# Patient Record
Sex: Male | Born: 1960 | Race: White | Hispanic: No | Marital: Married | Smoking: Former smoker
Health system: Southern US, Community
[De-identification: ages and names within clinical notes are randomized; demographics above are authoritative.]

## PROBLEM LIST (undated history)

## (undated) DIAGNOSIS — E119 Type 2 diabetes mellitus without complications: Secondary | ICD-10-CM

---

## 2006-03-17 ENCOUNTER — Inpatient Hospital Stay: Payer: Self-pay | Admitting: Internal Medicine

## 2006-03-17 ENCOUNTER — Other Ambulatory Visit: Payer: Self-pay

## 2007-07-12 ENCOUNTER — Ambulatory Visit: Payer: Self-pay | Admitting: Family Medicine

## 2007-08-29 ENCOUNTER — Other Ambulatory Visit: Payer: Self-pay

## 2007-08-29 ENCOUNTER — Emergency Department: Payer: Self-pay | Admitting: Emergency Medicine

## 2008-07-07 ENCOUNTER — Ambulatory Visit: Payer: Self-pay | Admitting: Family Medicine

## 2008-10-06 ENCOUNTER — Ambulatory Visit: Payer: Self-pay | Admitting: Family Medicine

## 2009-03-29 ENCOUNTER — Ambulatory Visit: Payer: Self-pay | Admitting: Family Medicine

## 2009-11-25 ENCOUNTER — Ambulatory Visit: Payer: Self-pay | Admitting: Family Medicine

## 2010-08-19 ENCOUNTER — Ambulatory Visit: Payer: Self-pay | Admitting: Family Medicine

## 2012-08-14 ENCOUNTER — Ambulatory Visit: Payer: Self-pay | Admitting: Family Medicine

## 2012-08-14 LAB — CREATININE, SERUM
Creatinine: 0.98 mg/dL (ref 0.60–1.30)
EGFR (African American): >60

## 2013-06-04 ENCOUNTER — Ambulatory Visit: Payer: Self-pay | Admitting: Family Medicine

## 2013-06-04 LAB — CREATININE, SERUM
Creatinine: 1.09 mg/dL (ref 0.60–1.30)
EGFR (African American): >60
EGFR (Non-African Amer.): >60

## 2013-06-26 ENCOUNTER — Ambulatory Visit: Payer: Self-pay | Admitting: Family Medicine

## 2013-07-22 ENCOUNTER — Encounter: Payer: Self-pay | Admitting: *Deleted

## 2013-08-13 ENCOUNTER — Ambulatory Visit: Payer: Self-pay | Admitting: General Surgery

## 2013-08-26 ENCOUNTER — Encounter: Payer: Self-pay | Admitting: General Surgery

## 2013-08-27 ENCOUNTER — Ambulatory Visit: Payer: Self-pay | Admitting: General Surgery

## 2013-09-22 ENCOUNTER — Ambulatory Visit: Payer: Self-pay | Admitting: General Surgery

## 2013-10-15 ENCOUNTER — Encounter: Payer: Self-pay | Admitting: *Deleted

## 2014-02-23 ENCOUNTER — Ambulatory Visit: Payer: Self-pay | Admitting: Family Medicine

## 2014-09-01 ENCOUNTER — Ambulatory Visit: Payer: Self-pay | Admitting: Family Medicine

## 2015-04-05 ENCOUNTER — Other Ambulatory Visit: Payer: Self-pay | Admitting: Family Medicine

## 2015-04-05 DIAGNOSIS — R1010 Upper abdominal pain, unspecified: Secondary | ICD-10-CM

## 2015-04-07 ENCOUNTER — Ambulatory Visit: Payer: Medicare Other

## 2015-04-08 ENCOUNTER — Ambulatory Visit
Admission: RE | Admit: 2015-04-08 | Discharge: 2015-04-08 | Disposition: A | Discharge status: Home / Self Care | Payer: Medicare Other | Source: Ambulatory Visit | Attending: Family Medicine | Admitting: Family Medicine

## 2015-04-08 DIAGNOSIS — K861 Other chronic pancreatitis: Secondary | ICD-10-CM | POA: Insufficient documentation

## 2015-04-08 DIAGNOSIS — R1010 Upper abdominal pain, unspecified: Secondary | ICD-10-CM

## 2015-04-08 DIAGNOSIS — K7689 Other specified diseases of liver: Secondary | ICD-10-CM | POA: Insufficient documentation

## 2015-04-08 HISTORY — DX: Type 2 diabetes mellitus without complications: E11.9

## 2015-04-08 MED ORDER — IOHEXOL 300 MG/ML  SOLN
100.0000 mL | Freq: Once | INTRAMUSCULAR | Status: AC | PRN
  Administered 2015-04-08: 100 mL via INTRAVENOUS

## 2016-03-09 ENCOUNTER — Other Ambulatory Visit: Payer: Self-pay | Admitting: Family Medicine

## 2016-03-09 DIAGNOSIS — K861 Other chronic pancreatitis: Secondary | ICD-10-CM

## 2016-03-09 DIAGNOSIS — K8689 Other specified diseases of pancreas: Secondary | ICD-10-CM

## 2016-03-14 ENCOUNTER — Ambulatory Visit
Admission: RE | Admit: 2016-03-14 | Discharge: 2016-03-14 | Disposition: A | Discharge status: Home / Self Care | Payer: Medicare Other | Source: Ambulatory Visit | Attending: Family Medicine | Admitting: Family Medicine

## 2016-03-14 DIAGNOSIS — K861 Other chronic pancreatitis: Secondary | ICD-10-CM | POA: Diagnosis not present

## 2016-03-14 DIAGNOSIS — I2510 Atherosclerotic heart disease of native coronary artery without angina pectoris: Secondary | ICD-10-CM | POA: Insufficient documentation

## 2016-03-14 MED ORDER — IOPAMIDOL (ISOVUE-300) INJECTION 61%
100.0000 mL | Freq: Once | INTRAVENOUS | Status: AC | PRN
  Administered 2016-03-14: 100 mL via INTRAVENOUS

## 2017-03-24 IMAGING — CT CT ABDOMEN W/ CM
2 of 5 series · 16 of 46 positions shown, 18 images · IV contrast (iopamidol)
Comparison: CT scan 04/08/2015

CLINICAL DATA: Epigastric abdominal pain for 6 weeks with more
severe pain 2 weeks ago. History of idiopathic chronic pancreatitis.

EXAM:
CT ABDOMEN WITH CONTRAST
TECHNIQUE: Multidetector CT imaging of the abdomen was performed using the
standard protocol following bolus administration of intravenous
contrast.
CONTRAST:  100mL ODTKDZ-9YY IOPAMIDOL (ODTKDZ-9YY) INJECTION 61%

[Series 2: axial soft tissue · axial · 0.75mm/px · z∈[-760,-490]mm · 13 of 64 slices shown, 15 images]
[im 5/64  soft-tissue]
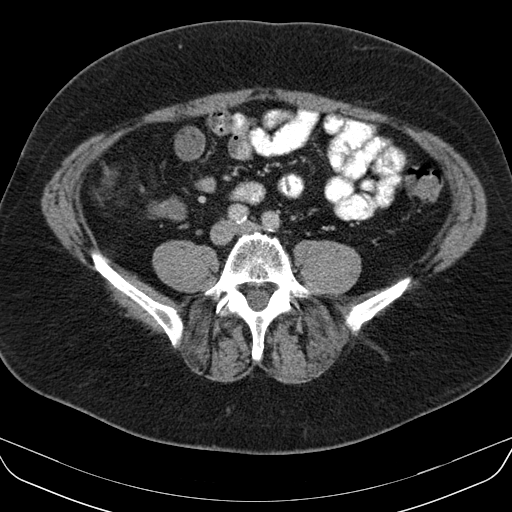
[im 5/64  bone]
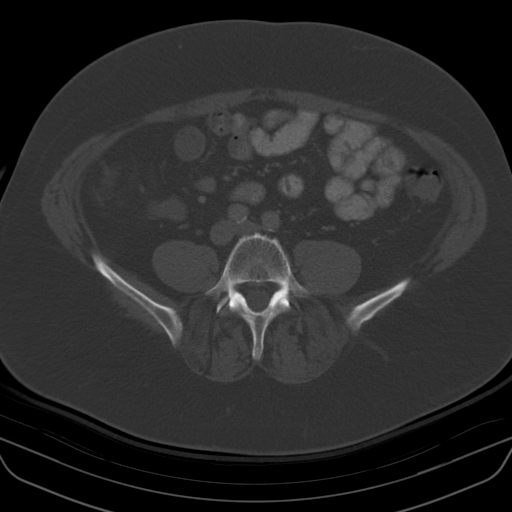
[im 9/64  soft-tissue]
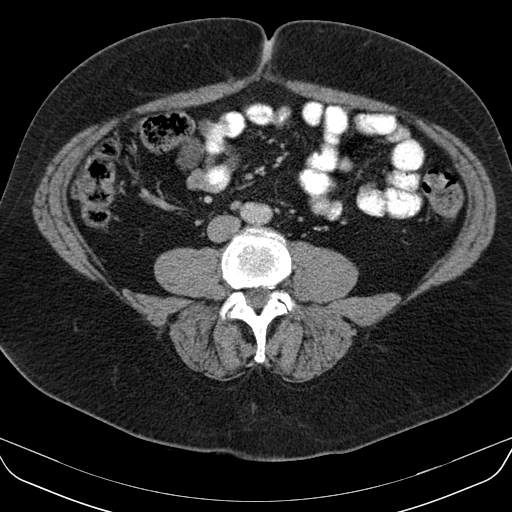
[im 13/64  soft-tissue]
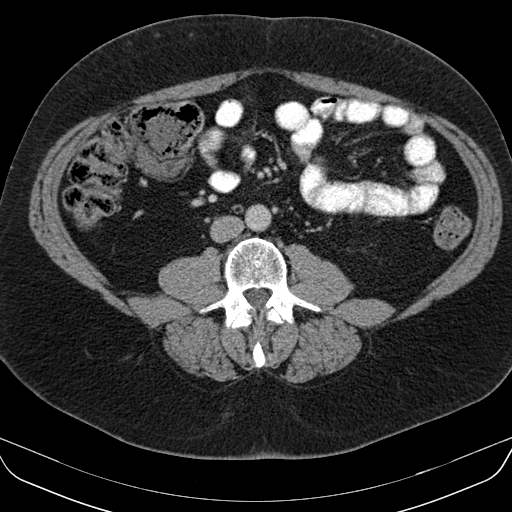
[im 17/64  soft-tissue]
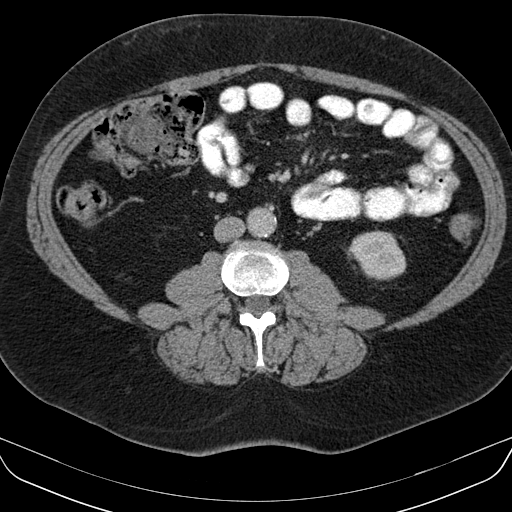
[im 22/64  soft-tissue]
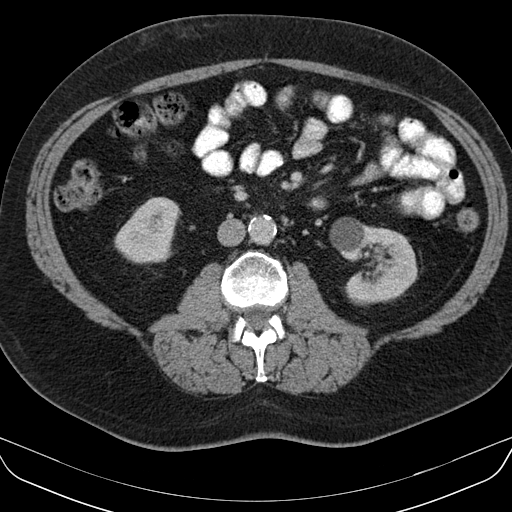
[im 26/64  soft-tissue]
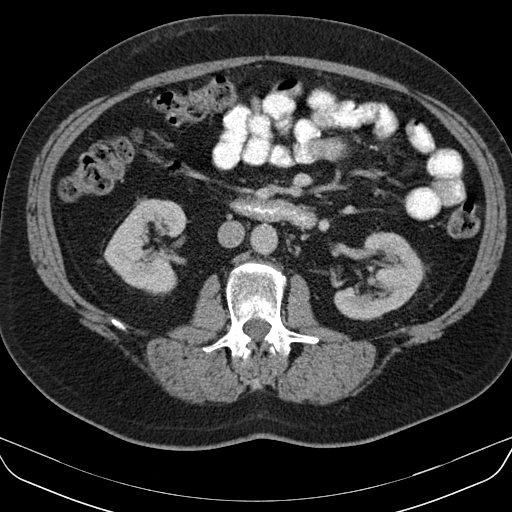
[im 34/64  soft-tissue]
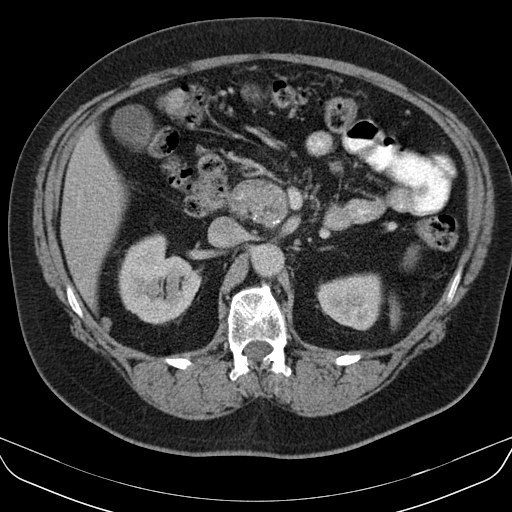
[im 38/64  soft-tissue]
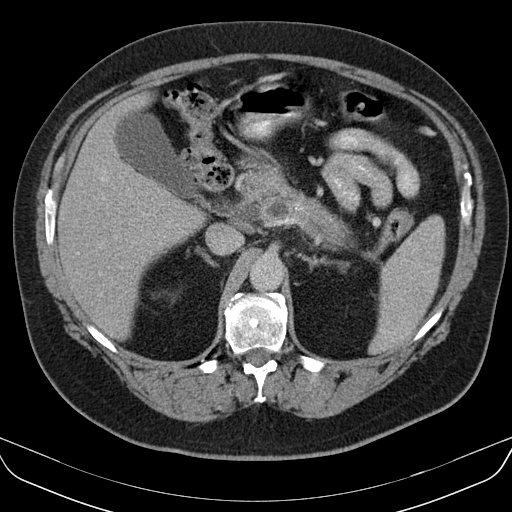
[im 43/64  soft-tissue]
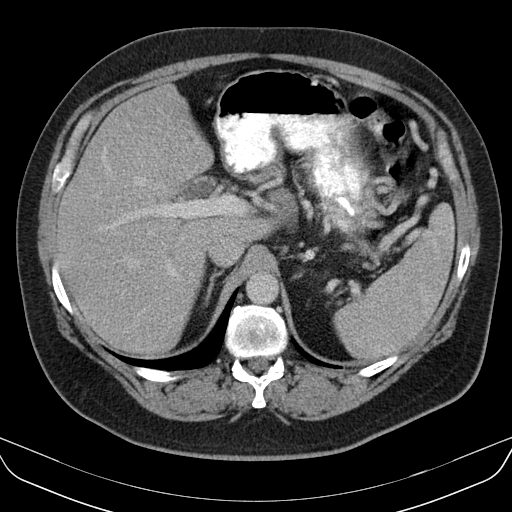
[im 43/64  bone]
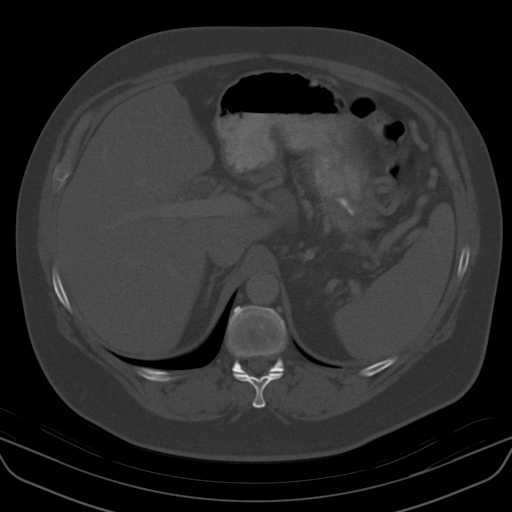
[im 47/64  soft-tissue]
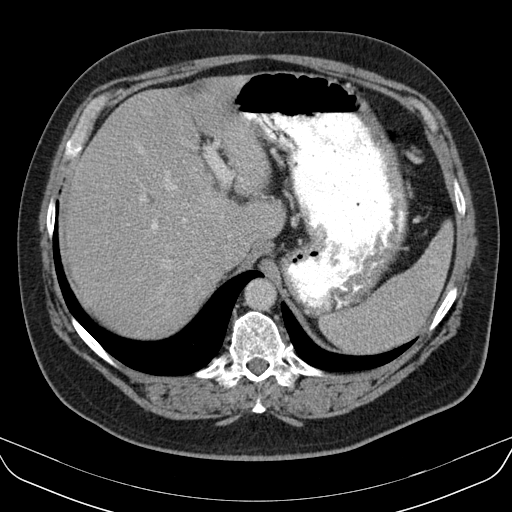
[im 51/64  soft-tissue]
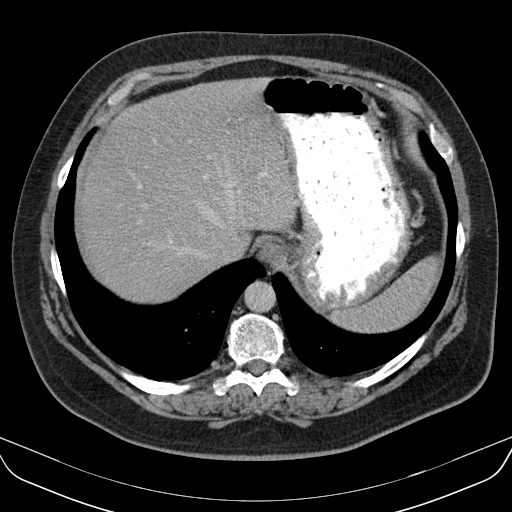
[im 55/64  soft-tissue]
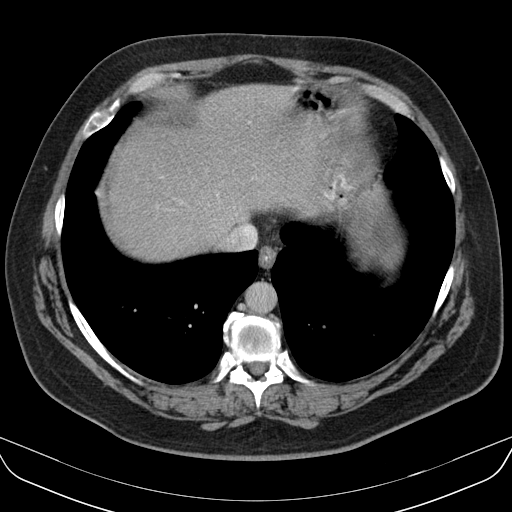
[im 59/64  soft-tissue]
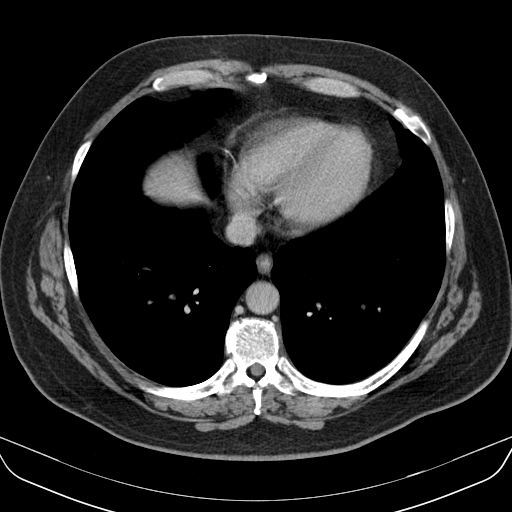

[Series 602: coronal · coronal · 0.75mm/px · 3 of 140 slices shown]
[im 47/140  soft-tissue]
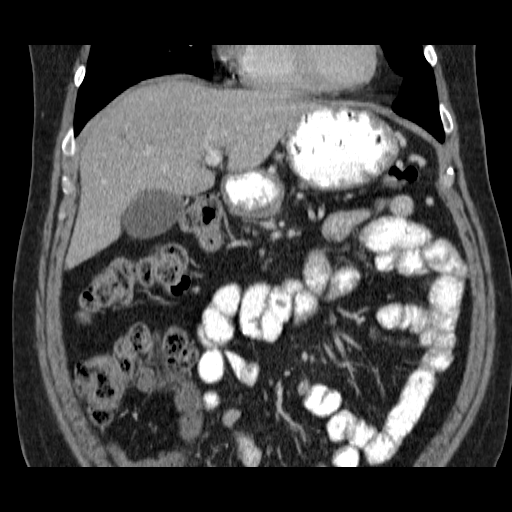
[im 62/140  soft-tissue]
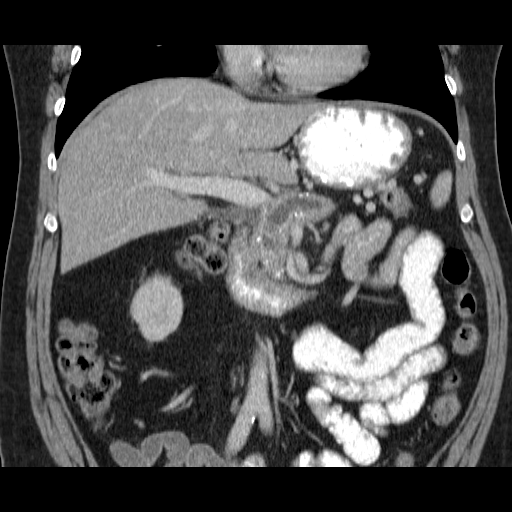
[im 78/140  soft-tissue]
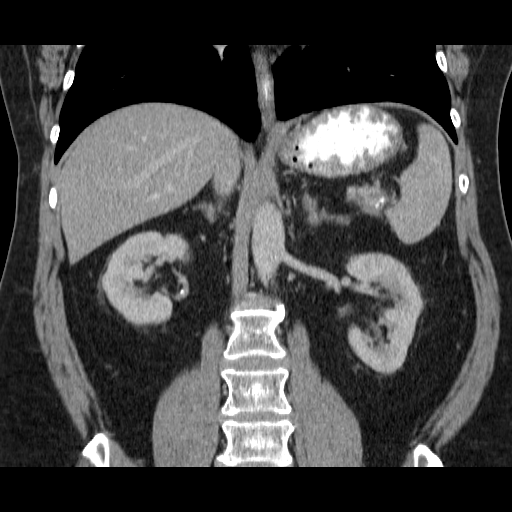

[16 of 46 positions shown; findings below may reference images not displayed]

FINDINGS: Lower chest: The lung bases are clear. No pulmonary nodules. The
heart is normal in size. No pericardial effusion. Coronary artery
calcifications are noted hand are somewhat advanced for the
patient's age.

Hepatobiliary: Small scattered hepatic cysts are again demonstrated.
No worrisome hepatic lesions or intrahepatic biliary dilatation. The
gallbladder is normal. Mild stable common bile duct dilatation.

Pancreas: Stable changes of chronic calcific pancreatitis with a
dilated main pancreatic duct. No CT findings for acute pancreatitis,
pancreatic head mass or ampullary lesion.

Spleen: Normal size.  No focal lesions.

Adrenals/Urinary Tract: The adrenal glands and kidneys are
unremarkable and stable. Stable lower pole left renal cyst.

Stomach/Bowel: The stomach, duodenum, visualized small bowel and
visualized colon are unremarkable.

Vascular/Lymphatic: The aorta and branch vessels are patent. Stable
atherosclerotic calcifications involving the aorta. No dissection.
The major venous structures are patent except for chronic occlusion
of the splenic vein with extensive perigastric collateral vessels.
The portal vein is patent.

Other: No ascites or abdominal hernia.

Musculoskeletal: No significant bony findings.
IMPRESSION: 1. Stable changes of chronic calcific pancreatitis without findings
for superimposed acute pancreatitis. No pancreatic mass.
2. Chronic splenic vein occlusion with extensive perigastric
collaterals.
3. No acute abdominal findings, mass lesions or lymphadenopathy.
4. Age advanced coronary artery calcifications.

## 2020-10-13 ENCOUNTER — Other Ambulatory Visit: Payer: Self-pay

## 2020-10-13 ENCOUNTER — Encounter: Payer: Medicare Other | Attending: Cardiology

## 2020-10-13 DIAGNOSIS — Z87891 Personal history of nicotine dependence: Secondary | ICD-10-CM | POA: Insufficient documentation

## 2020-10-13 DIAGNOSIS — Z9861 Coronary angioplasty status: Secondary | ICD-10-CM | POA: Insufficient documentation

## 2020-10-13 DIAGNOSIS — Z79899 Other long term (current) drug therapy: Secondary | ICD-10-CM | POA: Insufficient documentation

## 2020-10-13 DIAGNOSIS — Z7982 Long term (current) use of aspirin: Secondary | ICD-10-CM | POA: Insufficient documentation

## 2020-10-13 NOTE — Progress Notes (Signed)
Virtual Visit completed. Patient informed on EP and RD appointment and 6 Minute walk test. Patient also informed of patient health questionnaires on My Chart. Patient Verbalizes understanding. Visit diagnosis can be found in [facility]12/22/2021.

## 2020-10-18 ENCOUNTER — Other Ambulatory Visit: Payer: Self-pay

## 2020-10-18 VITALS — Ht 70.75 in | Wt 260.5 lb

## 2020-10-18 DIAGNOSIS — Z7982 Long term (current) use of aspirin: Secondary | ICD-10-CM | POA: Diagnosis not present

## 2020-10-18 DIAGNOSIS — Z9861 Coronary angioplasty status: Secondary | ICD-10-CM | POA: Diagnosis present

## 2020-10-18 DIAGNOSIS — Z87891 Personal history of nicotine dependence: Secondary | ICD-10-CM | POA: Diagnosis not present

## 2020-10-18 DIAGNOSIS — Z79899 Other long term (current) drug therapy: Secondary | ICD-10-CM | POA: Diagnosis not present

## 2020-10-18 NOTE — Progress Notes (Signed)
Cardiac Individual Treatment Plan  Patient Details  Name: Derek Dickerson MRN: [id number] Date of Birth: October 21, 1960 Referring Provider:   Flowsheet Row Cardiac Rehab from 10/18/2020 in [facility] Cardiac and Pulmonary Rehab  Referring Provider [name]      Initial Encounter Date:  Flowsheet Row Cardiac Rehab from 10/18/2020 in [facility] Cardiac and Pulmonary Rehab  Date 10/18/20      Visit Diagnosis: S/P PTCA (percutaneous transluminal coronary angioplasty)  Patient's Home Medications on Admission:  Current Outpatient Medications:  .  aspirin 81 MG EC tablet, Take by mouth., Disp: , Rfl:  .  Blood Glucose Monitoring Suppl (FIFTY50 GLUCOSE METER 2.0) w/Device KIT, Tests 8 times a day, One touch Ultra 2 glucometer, E08.9, Disp: , Rfl:  .  buPROPion (WELLBUTRIN XL) 300 MG 24 hr tablet, Take by mouth., Disp: , Rfl:  .  cetirizine-pseudoephedrine (ZYRTEC-D) 5-120 MG tablet, Take by mouth., Disp: , Rfl:  .  Cholecalciferol 25 MCG (1000 UT) tablet, Take by mouth., Disp: , Rfl:  .  clopidogrel (PLAVIX) 75 MG tablet, Take by mouth., Disp: , Rfl:  .  EPINEPHrine 0.3 mg/0.3 mL IJ SOAJ injection, Inject into the muscle., Disp: , Rfl:  .  ferrous sulfate 325 (65 FE) MG tablet, Take by mouth., Disp: , Rfl:  .  gabapentin (NEURONTIN) 100 MG capsule, Take by mouth., Disp: , Rfl:  .  glucose blood (PRECISION QID TEST) test strip, Use one. 5 (five) to 8 (eight) times daily as needed. Use as instructed. ONE TOUCH ULTRA, Disp: , Rfl:  .  insulin glargine (LANTUS) 100 UNIT/ML Solostar Pen, Inject into the skin., Disp: , Rfl:  .  insulin lispro (HUMALOG) 100 UNIT/ML KwikPen, INJECT 20-30 UNITS WITH MEALS OR SNACKS UP TO FIVE TIMES DAILY (MAX OF 140 UNITS PER DAY), Disp: , Rfl:  .  Insulin Pen Needle (B-D UF III MINI PEN NEEDLES) 31G X 5 MM MISC, USE WITH INSULIN 7 TIMES DAILY, Disp: , Rfl:  .  omeprazole (PRILOSEC) 40 MG capsule, Take by mouth., Disp: , Rfl:  .  oxyCODONE (OXY IR/ROXICODONE) 5 MG immediate release  tablet, Take by mouth., Disp: , Rfl:  .  Pancrelipase, Lip-Prot-Amyl, (CREON) 24000-76000 units CPEP, Take by mouth., Disp: , Rfl:  .  rosuvastatin (CRESTOR) 20 MG tablet, Take by mouth., Disp: , Rfl:   Past Medical History: Past Medical History:  Diagnosis Date  . Diabetes mellitus without complication ([place])     Tobacco Use: Social History   Tobacco Use  Smoking Status Former Smoker  . Packs/day: 1.00  . Years: 40.00  . Pack years: 40.00  . Types: Cigarettes  . Quit date: 2013  . Years since quitting: 9.0  Smokeless Tobacco Never Used    Labs: Recent Review Flowsheet Data   There is no flowsheet data to display.      Exercise Target Goals: Exercise Program Goal: Individual exercise prescription set using results from initial 6 min walk test and THRR while considering  patient's activity barriers and safety.   Exercise Prescription Goal: Initial exercise prescription builds to 30-45 minutes a day of aerobic activity, 2-3 days per week.  Home exercise guidelines will be given to patient during program as part of exercise prescription that the participant will acknowledge.   Education: Aerobic Exercise: - Group verbal and visual presentation on the components of exercise prescription. Introduces F.I.T.T principle from ACSM for exercise prescriptions.  Reviews F.I.T.T. principles of aerobic exercise including progression. Written material given at graduation.   Education: Resistance Exercise: -  Group verbal and visual presentation on the components of exercise prescription. Introduces F.I.T.T principle from ACSM for exercise prescriptions  Reviews F.I.T.T. principles of resistance exercise including progression. Written material given at graduation.    Education: Exercise & Equipment Safety: - Individual verbal instruction and demonstration of equipment use and safety with use of the equipment. Flowsheet Row Cardiac Rehab from 10/18/2020 in [facility] Cardiac and Pulmonary  Rehab  Date 10/18/20  Educator AS  Instruction Review Code 1- Verbalizes Understanding      Education: Exercise Physiology & General Exercise Guidelines: - Group verbal and written instruction with models to review the exercise physiology of the cardiovascular system and associated critical values. Provides general exercise guidelines with specific guidelines to those with heart or lung disease.    Education: Flexibility, Balance, Mind/Body Relaxation: - Group verbal and visual presentation with interactive activity on the components of exercise prescription. Introduces F.I.T.T principle from ACSM for exercise prescriptions. Reviews F.I.T.T. principles of flexibility and balance exercise training including progression. Also discusses the mind body connection.  Reviews various relaxation techniques to help reduce and manage stress (i.e. Deep breathing, progressive muscle relaxation, and visualization). Balance handout provided to take home. Written material given at graduation.   Activity Barriers & Risk Stratification:   6 Minute Walk:  6 Minute Walk    Row Name 10/18/20 1634         6 Minute Walk   Phase Initial     Distance 1400 feet     Walk Time 6 minutes     # of Rest Breaks 0     MPH 2.65     METS 3.3     RPE 12     Perceived Dyspnea  1     VO2 Peak 11.5     Symptoms No     Resting HR 75 bpm     Resting BP 136/72     Resting Oxygen Saturation  99 %     Exercise Oxygen Saturation  during 6 min walk 98 %     Max Ex. HR 103 bpm     Max Ex. BP 160/76     2 Minute Post BP 134/60            Oxygen Initial Assessment:   Oxygen Re-Evaluation:   Oxygen Discharge (Final Oxygen Re-Evaluation):   Initial Exercise Prescription:  Initial Exercise Prescription - 10/18/20 1600      Date of Initial Exercise RX and Referring Provider   Date 10/18/20    Referring Provider [name]      Treadmill   MPH 2.5    Grade 0.5    Minutes 15    METs 3.3      REL-XR    Level 3    Speed 50    Minutes 15    METs 3.3      T5 Nustep   Level 2    SPM 80    Minutes 15    METs 3.3      Prescription Details   Frequency (times per week) 3    Duration Progress to 30 minutes of continuous aerobic without signs/symptoms of physical distress      Intensity   THRR 40-80% of Max Heartrate 109-142    Ratings of Perceived Exertion 11-15    Perceived Dyspnea 0-4      Resistance Training   Training Prescription Yes    Weight 4 lb    Reps 10-15  Perform Capillary Blood Glucose checks as needed.  Exercise Prescription Changes:  Exercise Prescription Changes    Row Name 10/18/20 1600             Response to Exercise   Blood Pressure (Admit) 136/72       Blood Pressure (Exercise) 160/76       Blood Pressure (Exit) 134/60       Heart Rate (Admit) 75 bpm       Heart Rate (Exercise) 103 bpm       Heart Rate (Exit) 89 bpm       Oxygen Saturation (Admit) 99 %       Oxygen Saturation (Exercise) 98 %       Rating of Perceived Exertion (Exercise) 12       Perceived Dyspnea (Exercise) 1       Symptoms none              Exercise Comments:   Exercise Goals and Review:  Exercise Goals    Row Name 10/18/20 1637             Exercise Goals   Increase Physical Activity Yes       Intervention Provide advice, education, support and counseling about physical activity/exercise needs.;Develop an individualized exercise prescription for aerobic and resistive training based on initial evaluation findings, risk stratification, comorbidities and participant's personal goals.       Expected Outcomes Short Term: Attend rehab on a regular basis to increase amount of physical activity.;Long Term: Add in home exercise to make exercise part of routine and to increase amount of physical activity.;Long Term: Exercising regularly at least 3-5 days a week.       Increase Strength and Stamina Yes       Intervention Provide advice, education, support and  counseling about physical activity/exercise needs.       Expected Outcomes Short Term: Increase workloads from initial exercise prescription for resistance, speed, and METs.;Short Term: Perform resistance training exercises routinely during rehab and add in resistance training at home;Long Term: Improve cardiorespiratory fitness, muscular endurance and strength as measured by increased METs and functional capacity (6MWT)       Able to understand and use rate of perceived exertion (RPE) scale Yes       Intervention Provide education and explanation on how to use RPE scale       Expected Outcomes Short Term: Able to use RPE daily in rehab to express subjective intensity level;Long Term:  Able to use RPE to guide intensity level when exercising independently       Able to understand and use Dyspnea scale Yes       Intervention Provide education and explanation on how to use Dyspnea scale       Expected Outcomes Short Term: Able to use Dyspnea scale daily in rehab to express subjective sense of shortness of breath during exertion;Long Term: Able to use Dyspnea scale to guide intensity level when exercising independently       Knowledge and understanding of Target Heart Rate Range (THRR) Yes       Intervention Provide education and explanation of THRR including how the numbers were predicted and where they are located for reference       Expected Outcomes Short Term: Able to state/look up THRR;Short Term: Able to use daily as guideline for intensity in rehab;Long Term: Able to use THRR to govern intensity when exercising independently       Able to check pulse independently  Yes       Intervention Provide education and demonstration on how to check pulse in carotid and radial arteries.;Review the importance of being able to check your own pulse for safety during independent exercise       Expected Outcomes Short Term: Able to explain why pulse checking is important during independent exercise;Long Term: Able  to check pulse independently and accurately       Understanding of Exercise Prescription Yes       Intervention Provide education, explanation, and written materials on patient's individual exercise prescription       Expected Outcomes Short Term: Able to explain program exercise prescription;Long Term: Able to explain home exercise prescription to exercise independently              Exercise Goals Re-Evaluation :   Discharge Exercise Prescription (Final Exercise Prescription Changes):  Exercise Prescription Changes - 10/18/20 1600      Response to Exercise   Blood Pressure (Admit) 136/72    Blood Pressure (Exercise) 160/76    Blood Pressure (Exit) 134/60    Heart Rate (Admit) 75 bpm    Heart Rate (Exercise) 103 bpm    Heart Rate (Exit) 89 bpm    Oxygen Saturation (Admit) 99 %    Oxygen Saturation (Exercise) 98 %    Rating of Perceived Exertion (Exercise) 12    Perceived Dyspnea (Exercise) 1    Symptoms none           Nutrition:  Target Goals: Understanding of nutrition guidelines, daily intake of sodium '1500mg'$ , cholesterol '200mg'$ , calories 30% from fat and 7% or less from saturated fats, daily to have 5 or more servings of fruits and vegetables.  Education: All About Nutrition: -Group instruction provided by verbal, written material, interactive activities, discussions, models, and posters to present general guidelines for heart healthy nutrition including fat, fiber, MyPlate, the role of sodium in heart healthy nutrition, utilization of the nutrition label, and utilization of this knowledge for meal planning. Follow up email sent as well. Written material given at graduation.   Biometrics:  Pre Biometrics - 10/18/20 1637      Pre Biometrics   Height 5' 10.75" (1.797 m)    Weight 260 lb 8 oz (118.2 kg)    BMI (Calculated) 36.59            Nutrition Therapy Plan and Nutrition Goals:   Nutrition Assessments:  MEDIFICTS Score Key:  ?70 Need to make dietary  changes   40-70 Heart Healthy Diet  ? 40 Therapeutic Level Cholesterol Diet  Flowsheet Row Cardiac Rehab from 10/18/2020 in [facility] Cardiac and Pulmonary Rehab  Picture Your Plate Total Score on Admission 59     Picture Your Plate Scores:  <01 Unhealthy dietary pattern with much room for improvement.  41-50 Dietary pattern unlikely to meet recommendations for good health and room for improvement.  51-60 More healthful dietary pattern, with some room for improvement.   >60 Healthy dietary pattern, although there may be some specific behaviors that could be improved.    Nutrition Goals Re-Evaluation:   Nutrition Goals Discharge (Final Nutrition Goals Re-Evaluation):   Psychosocial: Target Goals: Acknowledge presence or absence of significant depression and/or stress, maximize coping skills, provide positive support system. Participant is able to verbalize types and ability to use techniques and skills needed for reducing stress and depression.   Education: Stress, Anxiety, and Depression - Group verbal and visual presentation to define topics covered.  Reviews how body is impacted by  stress, anxiety, and depression.  Also discusses healthy ways to reduce stress and to treat/manage anxiety and depression.  Written material given at graduation.   Education: Sleep Hygiene -Provides group verbal and written instruction about how sleep can affect your health.  Define sleep hygiene, discuss sleep cycles and impact of sleep habits. Review good sleep hygiene tips.    Initial Review & Psychosocial Screening:  Initial Psych Review & Screening - 10/13/20 1542      Initial Review   Current issues with History of Depression;Current Depression      Family Dynamics   Good Support System? Yes    Comments He can look to his wife and his three dogs.He has a posiive outlook on his health. Ronalee Belts takes medication for his mood and states that it helps.      Barriers   Psychosocial barriers to  participate in program The patient should benefit from training in stress management and relaxation.      Screening Interventions   Interventions Program counselor consult;To provide support and resources with identified psychosocial needs;Encouraged to exercise;Provide feedback about the scores to participant           Quality of Life Scores:   Quality of Life - 10/18/20 1642      Quality of Life   Select Quality of Life      Quality of Life Scores   Health/Function Pre 20.57 %    Socioeconomic Pre 26.57 %    Psych/Spiritual Pre 30 %    Family Pre 28.5 %    GLOBAL Pre 24.94 %          Scores of 19 and below usually indicate a poorer quality of life in these areas.  A difference of  2-3 points is a clinically meaningful difference.  A difference of 2-3 points in the total score of the Quality of Life Index has been associated with significant improvement in overall quality of life, self-image, physical symptoms, and general health in studies assessing change in quality of life.  PHQ-9: Recent Review Flowsheet Data    Depression screen [facility] 2/9 10/18/2020   Decreased Interest 0   Down, Depressed, Hopeless 0   PHQ - 2 Score 0   Altered sleeping 0   Tired, decreased energy 0   Change in appetite 0   Feeling bad or failure about yourself  0   Trouble concentrating 0   Moving slowly or fidgety/restless 0   Suicidal thoughts 0   PHQ-9 Score 0     Interpretation of Total Score  Total Score Depression Severity:  1-4 = Minimal depression, 5-9 = Mild depression, 10-14 = Moderate depression, 15-19 = Moderately severe depression, 20-27 = Severe depression   Psychosocial Evaluation and Intervention:  Psychosocial Evaluation - 10/13/20 1545      Psychosocial Evaluation & Interventions   Interventions Encouraged to exercise with the program and follow exercise prescription;Relaxation education;Stress management education    Comments He can look to his wife and his three dogs.He has  a posiive outlook on his health. Ronalee Belts takes medication for his mood and states that it helps.    Expected Outcomes Short: Exercise regularly to support mental health and notify staff of any changes. Long: maintain mental health and well being through teaching of rehab or prescribed medications independently.    Continue Psychosocial Services  Follow up required by staff           Psychosocial Re-Evaluation:   Psychosocial Discharge (Final Psychosocial Re-Evaluation):  Vocational Rehabilitation: Provide vocational rehab assistance to qualifying candidates.   Vocational Rehab Evaluation & Intervention:   Education: Education Goals: Education classes will be provided on a variety of topics geared toward better understanding of heart health and risk factor modification. Participant will state understanding/return demonstration of topics presented as noted by education test scores.  Learning Barriers/Preferences:  Learning Barriers/Preferences - 10/13/20 1541      Learning Barriers/Preferences   Learning Barriers None    Learning Preferences None           General Cardiac Education Topics:  AED/CPR: - Group verbal and written instruction with the use of models to demonstrate the basic use of the AED with the basic ABC's of resuscitation.   Anatomy and Cardiac Procedures: - Group verbal and visual presentation and models provide information about basic cardiac anatomy and function. Reviews the testing methods done to diagnose heart disease and the outcomes of the test results. Describes the treatment choices: Medical Management, Angioplasty, or Coronary Bypass Surgery for treating various heart conditions including Myocardial Infarction, Angina, Valve Disease, and Cardiac Arrhythmias.  Written material given at graduation.   Medication Safety: - Group verbal and visual instruction to review commonly prescribed medications for heart and lung disease. Reviews the medication,  class of the drug, and side effects. Includes the steps to properly store meds and maintain the prescription regimen.  Written material given at graduation.   Intimacy: - Group verbal instruction through game format to discuss how heart and lung disease can affect sexual intimacy. Written material given at graduation..   Know Your Numbers and Heart Failure: - Group verbal and visual instruction to discuss disease risk factors for cardiac and pulmonary disease and treatment options.  Reviews associated critical values for Overweight/Obesity, Hypertension, Cholesterol, and Diabetes.  Discusses basics of heart failure: signs/symptoms and treatments.  Introduces Heart Failure Zone chart for action plan for heart failure.  Written material given at graduation.   Infection Prevention: - Provides verbal and written material to individual with discussion of infection control including proper hand washing and proper equipment cleaning during exercise session. Flowsheet Row Cardiac Rehab from 10/18/2020 in [facility] Cardiac and Pulmonary Rehab  Date 10/18/20  Educator AS  Instruction Review Code 1- Verbalizes Understanding      Falls Prevention: - Provides verbal and written material to individual with discussion of falls prevention and safety. Flowsheet Row Cardiac Rehab from 10/18/2020 in [facility] Cardiac and Pulmonary Rehab  Date 10/18/20  Educator AS  Instruction Review Code 1- Verbalizes Understanding      Other: -Provides group and verbal instruction on various topics (see comments)   Knowledge Questionnaire Score:  Knowledge Questionnaire Score - 10/18/20 1639      Knowledge Questionnaire Score   Pre Score 21/26 exercise nutrition           Core Components/Risk Factors/Patient Goals at Admission:  Personal Goals and Risk Factors at Admission - 10/18/20 1643      Core Components/Risk Factors/Patient Goals on Admission    Weight Management Weight Loss;Yes    Intervention Weight  Management: Develop a combined nutrition and exercise program designed to reach desired caloric intake, while maintaining appropriate intake of nutrient and fiber, sodium and fats, and appropriate energy expenditure required for the weight goal.;Weight Management: Provide education and appropriate resources to help participant work on and attain dietary goals.;Weight Management/Obesity: Establish reasonable short term and long term weight goals.    Admit Weight 260 lb 8 oz (118.2 kg)  Goal Weight: Short Term 255 lb (115.7 kg)    Goal Weight: Long Term 225 lb (102.1 kg)    Expected Outcomes Long Term: Adherence to nutrition and physical activity/exercise program aimed toward attainment of established weight goal;Short Term: Continue to assess and modify interventions until short term weight is achieved;Weight Loss: Understanding of general recommendations for a balanced deficit meal plan, which promotes 1-2 lb weight loss per week and includes a negative energy balance of [phone] kcal/d;Understanding recommendations for meals to include 15-35% energy as protein, 25-35% energy from fat, 35-60% energy from carbohydrates, less than $RemoveB'200mg'wwCmZMYU$  of dietary cholesterol, 20-35 gm of total fiber daily;Understanding of distribution of calorie intake throughout the day with the consumption of 4-5 meals/snacks    Diabetes Yes    Intervention Provide education about signs/symptoms and action to take for hypo/hyperglycemia.;Provide education about proper nutrition, including hydration, and aerobic/resistive exercise prescription along with prescribed medications to achieve blood glucose in normal ranges: Fasting glucose 65-99 mg/dL    Expected Outcomes Short Term: Participant verbalizes understanding of the signs/symptoms and immediate care of hyper/hypoglycemia, proper foot care and importance of medication, aerobic/resistive exercise and nutrition plan for blood glucose control.;Long Term: Attainment of HbA1C < 7%.     Hypertension Yes    Intervention Provide education on lifestyle modifcations including regular physical activity/exercise, weight management, moderate sodium restriction and increased consumption of fresh fruit, vegetables, and low fat dairy, alcohol moderation, and smoking cessation.;Monitor prescription use compliance.    Expected Outcomes Short Term: Continued assessment and intervention until BP is < 140/75mm HG in hypertensive participants. < 130/56mm HG in hypertensive participants with diabetes, heart failure or chronic kidney disease.;Long Term: Maintenance of blood pressure at goal levels.    Lipids Yes    Intervention Provide education and support for participant on nutrition & aerobic/resistive exercise along with prescribed medications to achieve LDL '70mg'$ , HDL >$Remo'40mg'edDog$ .    Expected Outcomes Short Term: Participant states understanding of desired cholesterol values and is compliant with medications prescribed. Participant is following exercise prescription and nutrition guidelines.;Long Term: Cholesterol controlled with medications as prescribed, with individualized exercise RX and with personalized nutrition plan. Value goals: LDL < $Rem'70mg'FPWd$ , HDL > 40 mg.           Education:Diabetes - Individual verbal and written instruction to review signs/symptoms of diabetes, desired ranges of glucose level fasting, after meals and with exercise. Acknowledge that pre and post exercise glucose checks will be done for 3 sessions at entry of program. [place] from 10/18/2020 in [facility] Cardiac and Pulmonary Rehab  Date 10/18/20  Educator AS  Instruction Review Code 1- Verbalizes Understanding      Core Components/Risk Factors/Patient Goals Review:    Core Components/Risk Factors/Patient Goals at Discharge (Final Review):    ITP Comments:  ITP Comments    Row Name 10/13/20 1540           ITP Comments Virtual Visit completed. Patient informed on EP and RD appointment and 6 Minute  walk test. Patient also informed of patient health questionnaires on My Chart. Patient Verbalizes understanding. Visit diagnosis can be found in [facility]12/22/2021.              Comments:initial ITP

## 2020-10-18 NOTE — Patient Instructions (Signed)
Patient Instructions  Patient Details  Name: Derek Dickerson MRN: [id number] Date of Birth: [birth year] Referring Provider:  Gae Gallop, *  Below are your personal goals for exercise, nutrition, and risk factors. Our goal is to help you stay on track towards obtaining and maintaining these goals. We will be discussing your progress on these goals with you throughout the program.  Initial Exercise Prescription:  Initial Exercise Prescription - 10/18/20 1600      Date of Initial Exercise RX and Referring Provider   Date 10/18/20    Referring Provider Margaretha Glassing      Treadmill   MPH 2.5    Grade 0.5    Minutes 15    METs 3.3      REL-XR   Level 3    Speed 50    Minutes 15    METs 3.3      T5 Nustep   Level 2    SPM 80    Minutes 15    METs 3.3      Prescription Details   Frequency (times per week) 3    Duration Progress to 30 minutes of continuous aerobic without signs/symptoms of physical distress      Intensity   THRR 40-80% of Max Heartrate 109-142    Ratings of Perceived Exertion 11-15    Perceived Dyspnea 0-4      Resistance Training   Training Prescription Yes    Weight 4 lb    Reps 10-15           Exercise Goals: Frequency: Be able to perform aerobic exercise two to three times per week in program working toward 2-5 days per week of home exercise.  Intensity: Work with a perceived exertion of 11 (fairly light) - 15 (hard) while following your exercise prescription.  We will make changes to your prescription with you as you progress through the program.   Duration: Be able to do 30 to 45 minutes of continuous aerobic exercise in addition to a 5 minute warm-up and a 5 minute cool-down routine.   Nutrition Goals: Your personal nutrition goals will be established when you do your nutrition analysis with the dietician.  The following are general nutrition guidelines to follow: Cholesterol < 200mg /day Sodium < 1500mg /day Fiber: Men over 50 yrs - 30  grams per day  Personal Goals:  Personal Goals and Risk Factors at Admission - 10/18/20 1643      Core Components/Risk Factors/Patient Goals on Admission    Weight Management Weight Loss;Yes    Intervention Weight Management: Develop a combined nutrition and exercise program designed to reach desired caloric intake, while maintaining appropriate intake of nutrient and fiber, sodium and fats, and appropriate energy expenditure required for the weight goal.;Weight Management: Provide education and appropriate resources to help participant work on and attain dietary goals.;Weight Management/Obesity: Establish reasonable short term and long term weight goals.    Admit Weight 260 lb 8 oz (118.2 kg)    Goal Weight: Short Term 255 lb (115.7 kg)    Goal Weight: Long Term 225 lb (102.1 kg)    Expected Outcomes Long Term: Adherence to nutrition and physical activity/exercise program aimed toward attainment of established weight goal;Short Term: Continue to assess and modify interventions until short term weight is achieved;Weight Loss: Understanding of general recommendations for a balanced deficit meal plan, which promotes 1-2 lb weight loss per week and includes a negative energy balance of [phone] kcal/d;Understanding recommendations for meals to include 15-35% energy as protein, 25-35%  energy from fat, 35-60% energy from carbohydrates, less than 200mg  of dietary cholesterol, 20-35 gm of total fiber daily;Understanding of distribution of calorie intake throughout the day with the consumption of 4-5 meals/snacks    Diabetes Yes    Intervention Provide education about signs/symptoms and action to take for hypo/hyperglycemia.;Provide education about proper nutrition, including hydration, and aerobic/resistive exercise prescription along with prescribed medications to achieve blood glucose in normal ranges: Fasting glucose 65-99 mg/dL    Expected Outcomes Short Term: Participant verbalizes understanding of the  signs/symptoms and immediate care of hyper/hypoglycemia, proper foot care and importance of medication, aerobic/resistive exercise and nutrition plan for blood glucose control.;Long Term: Attainment of HbA1C < 7%.    Hypertension Yes    Intervention Provide education on lifestyle modifcations including regular physical activity/exercise, weight management, moderate sodium restriction and increased consumption of fresh fruit, vegetables, and low fat dairy, alcohol moderation, and smoking cessation.;Monitor prescription use compliance.    Expected Outcomes Short Term: Continued assessment and intervention until BP is < 140/44mm HG in hypertensive participants. < 130/78mm HG in hypertensive participants with diabetes, heart failure or chronic kidney disease.;Long Term: Maintenance of blood pressure at goal levels.    Lipids Yes    Intervention Provide education and support for participant on nutrition & aerobic/resistive exercise along with prescribed medications to achieve LDL 70mg , HDL >40mg .    Expected Outcomes Short Term: Participant states understanding of desired cholesterol values and is compliant with medications prescribed. Participant is following exercise prescription and nutrition guidelines.;Long Term: Cholesterol controlled with medications as prescribed, with individualized exercise RX and with personalized nutrition plan. Value goals: LDL < 70mg , HDL > 40 mg.           Tobacco Use Initial Evaluation: Social History   Tobacco Use  Smoking Status Former Smoker  . Packs/day: 1.00  . Years: 40.00  . Pack years: 40.00  . Types: Cigarettes  . Quit date: 2013  . Years since quitting: 9.0  Smokeless Tobacco Never Used    Exercise Goals and Review:  Exercise Goals    Row Name 10/18/20 1637             Exercise Goals   Increase Physical Activity Yes       Intervention Provide advice, education, support and counseling about physical activity/exercise needs.;Develop an  individualized exercise prescription for aerobic and resistive training based on initial evaluation findings, risk stratification, comorbidities and participant's personal goals.       Expected Outcomes Short Term: Attend rehab on a regular basis to increase amount of physical activity.;Long Term: Add in home exercise to make exercise part of routine and to increase amount of physical activity.;Long Term: Exercising regularly at least 3-5 days a week.       Increase Strength and Stamina Yes       Intervention Provide advice, education, support and counseling about physical activity/exercise needs.       Expected Outcomes Short Term: Increase workloads from initial exercise prescription for resistance, speed, and METs.;Short Term: Perform resistance training exercises routinely during rehab and add in resistance training at home;Long Term: Improve cardiorespiratory fitness, muscular endurance and strength as measured by increased METs and functional capacity (2014)       Able to understand and use rate of perceived exertion (RPE) scale Yes       Intervention Provide education and explanation on how to use RPE scale       Expected Outcomes Short Term: Able to use RPE daily in  rehab to express subjective intensity level;Long Term:  Able to use RPE to guide intensity level when exercising independently       Able to understand and use Dyspnea scale Yes       Intervention Provide education and explanation on how to use Dyspnea scale       Expected Outcomes Short Term: Able to use Dyspnea scale daily in rehab to express subjective sense of shortness of breath during exertion;Long Term: Able to use Dyspnea scale to guide intensity level when exercising independently       Knowledge and understanding of Target Heart Rate Range (THRR) Yes       Intervention Provide education and explanation of THRR including how the numbers were predicted and where they are located for reference       Expected Outcomes Short  Term: Able to state/look up THRR;Short Term: Able to use daily as guideline for intensity in rehab;Long Term: Able to use THRR to govern intensity when exercising independently       Able to check pulse independently Yes       Intervention Provide education and demonstration on how to check pulse in carotid and radial arteries.;Review the importance of being able to check your own pulse for safety during independent exercise       Expected Outcomes Short Term: Able to explain why pulse checking is important during independent exercise;Long Term: Able to check pulse independently and accurately       Understanding of Exercise Prescription Yes       Intervention Provide education, explanation, and written materials on patient's individual exercise prescription       Expected Outcomes Short Term: Able to explain program exercise prescription;Long Term: Able to explain home exercise prescription to exercise independently              Copy of goals given to participant.

## 2020-11-01 ENCOUNTER — Encounter: Payer: Medicare Other | Admitting: *Deleted

## 2020-11-01 ENCOUNTER — Other Ambulatory Visit: Payer: Self-pay

## 2020-11-01 DIAGNOSIS — Z9861 Coronary angioplasty status: Secondary | ICD-10-CM | POA: Diagnosis not present

## 2020-11-01 LAB — GLUCOSE, CAPILLARY
Glucose-Capillary: 130 mg/dL — ABNORMAL HIGH (ref 70–99)
Glucose-Capillary: 155 mg/dL — ABNORMAL HIGH (ref 70–99)

## 2020-11-01 NOTE — Progress Notes (Signed)
Daily Session Note  Patient Details  Name: Derek Dickerson MRN: [id number] Date of Birth: 08-Jan-1961 Referring Provider:   Flowsheet Row Cardiac Rehab from 10/18/2020 in [facility]  Referring Provider [name]      Encounter Date: 11/01/2020  Check In:  Session Check In - 11/01/20 0835      Check-In   Supervising physician immediately available to respond to emergencies See telemetry face sheet for immediately available ER MD    Location [facility]-Cardiac & Pul[facility]hab    Staff Present Heath Lark, RN, BSN, CCRP;Joseph Hood RCP,RRT,BSRT;Kelly [place], [place], ACSM CEP, Exercise Physiologist    Virtual Visit No    Medication changes reported     No    Fall or balance concerns reported    No    Warm-up and Cool-down Performed on first and last piece of equipment    Resistance Training Performed Yes    VAD Patient? No    PAD/SET Patient? No      Pain Assessment   Currently in Pain? No/denies              Social History   Tobacco Use  Smoking Status Former Smoker  . Packs/day: 1.00  . Years: 40.00  . Pack years: 40.00  . Types: Cigarettes  . Quit date: 2013  . Years since quitting: 9.0  Smokeless Tobacco Never Used    Goals Met:  Exercise tolerated well Personal goals reviewed No report of cardiac concerns or symptoms  Goals Unmet:  Not Applicable  Comments: First full day of exercise!  Patient was oriented to gym and equipment including functions, settings, policies, and procedures.  Patient's individual exercise prescription and treatment plan were reviewed.  All starting workloads were established based on the results of the 6 minute walk test done at initial orientation visit.  The plan for exercise progression was also introduced and progression will be customized based on patient's performance and goals.    Dr. Emily Filbert is Medical Director for [place] and [facility].

## 2020-11-03 ENCOUNTER — Encounter: Payer: Self-pay | Admitting: *Deleted

## 2020-11-03 DIAGNOSIS — Z9861 Coronary angioplasty status: Secondary | ICD-10-CM

## 2020-11-03 NOTE — Progress Notes (Signed)
Cardiac Individual Treatment Plan  Patient Details  Name: Derek Dickerson MRN: [id number] Date of Birth: 1961/07/27 Referring Provider:   Flowsheet Row Cardiac Rehab from 10/18/2020 in [facility] Cardiac and Pulmonary Rehab  Referring Provider [name]      Initial Encounter Date:  Flowsheet Row Cardiac Rehab from 10/18/2020 in [facility] Cardiac and Pulmonary Rehab  Date 10/18/20      Visit Diagnosis: S/P PTCA (percutaneous transluminal coronary angioplasty)  Patient's Home Medications on Admission:  Current Outpatient Medications:  .  aspirin 81 MG EC tablet, Take by mouth., Disp: , Rfl:  .  Blood Glucose Monitoring Suppl (FIFTY50 GLUCOSE METER 2.0) w/Device KIT, Tests 8 times a day, One touch Ultra 2 glucometer, E08.9, Disp: , Rfl:  .  buPROPion (WELLBUTRIN XL) 300 MG 24 hr tablet, Take by mouth., Disp: , Rfl:  .  cetirizine-pseudoephedrine (ZYRTEC-D) 5-120 MG tablet, Take by mouth., Disp: , Rfl:  .  Cholecalciferol 25 MCG (1000 UT) tablet, Take by mouth., Disp: , Rfl:  .  clopidogrel (PLAVIX) 75 MG tablet, Take by mouth., Disp: , Rfl:  .  EPINEPHrine 0.3 mg/0.3 mL IJ SOAJ injection, Inject into the muscle., Disp: , Rfl:  .  ferrous sulfate 325 (65 FE) MG tablet, Take by mouth., Disp: , Rfl:  .  gabapentin (NEURONTIN) 100 MG capsule, Take by mouth., Disp: , Rfl:  .  glucose blood (PRECISION QID TEST) test strip, Use one. 5 (five) to 8 (eight) times daily as needed. Use as instructed. ONE TOUCH ULTRA, Disp: , Rfl:  .  insulin glargine (LANTUS) 100 UNIT/ML Solostar Pen, Inject into the skin., Disp: , Rfl:  .  insulin lispro (HUMALOG) 100 UNIT/ML KwikPen, INJECT 20-30 UNITS WITH MEALS OR SNACKS UP TO FIVE TIMES DAILY (MAX OF 140 UNITS PER DAY), Disp: , Rfl:  .  Insulin Pen Needle (B-D UF III MINI PEN NEEDLES) 31G X 5 MM MISC, USE WITH INSULIN 7 TIMES DAILY, Disp: , Rfl:  .  omeprazole (PRILOSEC) 40 MG capsule, Take by mouth., Disp: , Rfl:  .  oxyCODONE (OXY IR/ROXICODONE) 5 MG immediate release  tablet, Take by mouth., Disp: , Rfl:  .  Pancrelipase, Lip-Prot-Amyl, (CREON) 24000-76000 units CPEP, Take by mouth., Disp: , Rfl:  .  rosuvastatin (CRESTOR) 20 MG tablet, Take by mouth., Disp: , Rfl:   Past Medical History: Past Medical History:  Diagnosis Date  . Diabetes mellitus without complication ([place])     Tobacco Use: Social History   Tobacco Use  Smoking Status Former Smoker  . Packs/day: 1.00  . Years: 40.00  . Pack years: 40.00  . Types: Cigarettes  . Quit date: 2013  . Years since quitting: 9.0  Smokeless Tobacco Never Used    Labs: Recent Review Flowsheet Data   There is no flowsheet data to display.      Exercise Target Goals: Exercise Program Goal: Individual exercise prescription set using results from initial 6 min walk test and THRR while considering  patient's activity barriers and safety.   Exercise Prescription Goal: Initial exercise prescription builds to 30-45 minutes a day of aerobic activity, 2-3 days per week.  Home exercise guidelines will be given to patient during program as part of exercise prescription that the participant will acknowledge.   Education: Aerobic Exercise: - Group verbal and visual presentation on the components of exercise prescription. Introduces F.I.T.T principle from ACSM for exercise prescriptions.  Reviews F.I.T.T. principles of aerobic exercise including progression. Written material given at graduation.   Education: Resistance Exercise: -  Group verbal and visual presentation on the components of exercise prescription. Introduces F.I.T.T principle from ACSM for exercise prescriptions  Reviews F.I.T.T. principles of resistance exercise including progression. Written material given at graduation.    Education: Exercise & Equipment Safety: - Individual verbal instruction and demonstration of equipment use and safety with use of the equipment. Flowsheet Row Cardiac Rehab from 10/18/2020 in [facility] Cardiac and Pulmonary  Rehab  Date 10/18/20  Educator AS  Instruction Review Code 1- Verbalizes Understanding      Education: Exercise Physiology & General Exercise Guidelines: - Group verbal and written instruction with models to review the exercise physiology of the cardiovascular system and associated critical values. Provides general exercise guidelines with specific guidelines to those with heart or lung disease.    Education: Flexibility, Balance, Mind/Body Relaxation: - Group verbal and visual presentation with interactive activity on the components of exercise prescription. Introduces F.I.T.T principle from ACSM for exercise prescriptions. Reviews F.I.T.T. principles of flexibility and balance exercise training including progression. Also discusses the mind body connection.  Reviews various relaxation techniques to help reduce and manage stress (i.e. Deep breathing, progressive muscle relaxation, and visualization). Balance handout provided to take home. Written material given at graduation.   Activity Barriers & Risk Stratification:   6 Minute Walk:  6 Minute Walk    Row Name 10/18/20 1634         6 Minute Walk   Phase Initial     Distance 1400 feet     Walk Time 6 minutes     # of Rest Breaks 0     MPH 2.65     METS 3.3     RPE 12     Perceived Dyspnea  1     VO2 Peak 11.5     Symptoms No     Resting HR 75 bpm     Resting BP 136/72     Resting Oxygen Saturation  99 %     Exercise Oxygen Saturation  during 6 min walk 98 %     Max Ex. HR 103 bpm     Max Ex. BP 160/76     2 Minute Post BP 134/60            Oxygen Initial Assessment:   Oxygen Re-Evaluation:   Oxygen Discharge (Final Oxygen Re-Evaluation):   Initial Exercise Prescription:  Initial Exercise Prescription - 10/18/20 1600      Date of Initial Exercise RX and Referring Provider   Date 10/18/20    Referring Provider [name]      Treadmill   MPH 2.5    Grade 0.5    Minutes 15    METs 3.3      REL-XR    Level 3    Speed 50    Minutes 15    METs 3.3      T5 Nustep   Level 2    SPM 80    Minutes 15    METs 3.3      Prescription Details   Frequency (times per week) 3    Duration Progress to 30 minutes of continuous aerobic without signs/symptoms of physical distress      Intensity   THRR 40-80% of Max Heartrate 109-142    Ratings of Perceived Exertion 11-15    Perceived Dyspnea 0-4      Resistance Training   Training Prescription Yes    Weight 4 lb    Reps 10-15  Perform Capillary Blood Glucose checks as needed.  Exercise Prescription Changes:  Exercise Prescription Changes    Row Name 10/18/20 1600             Response to Exercise   Blood Pressure (Admit) 136/72       Blood Pressure (Exercise) 160/76       Blood Pressure (Exit) 134/60       Heart Rate (Admit) 75 bpm       Heart Rate (Exercise) 103 bpm       Heart Rate (Exit) 89 bpm       Oxygen Saturation (Admit) 99 %       Oxygen Saturation (Exercise) 98 %       Rating of Perceived Exertion (Exercise) 12       Perceived Dyspnea (Exercise) 1       Symptoms none              Exercise Comments:  Exercise Comments    Row Name 11/01/20 0836           Exercise Comments First full day of exercise!  Patient was oriented to gym and equipment including functions, settings, policies, and procedures.  Patient's individual exercise prescription and treatment plan were reviewed.  All starting workloads were established based on the results of the 6 minute walk test done at initial orientation visit.  The plan for exercise progression was also introduced and progression will be customized based on patient's performance and goals.              Exercise Goals and Review:  Exercise Goals    Row Name 10/18/20 1637             Exercise Goals   Increase Physical Activity Yes       Intervention Provide advice, education, support and counseling about physical activity/exercise needs.;Develop an  individualized exercise prescription for aerobic and resistive training based on initial evaluation findings, risk stratification, comorbidities and participant's personal goals.       Expected Outcomes Short Term: Attend rehab on a regular basis to increase amount of physical activity.;Long Term: Add in home exercise to make exercise part of routine and to increase amount of physical activity.;Long Term: Exercising regularly at least 3-5 days a week.       Increase Strength and Stamina Yes       Intervention Provide advice, education, support and counseling about physical activity/exercise needs.       Expected Outcomes Short Term: Increase workloads from initial exercise prescription for resistance, speed, and METs.;Short Term: Perform resistance training exercises routinely during rehab and add in resistance training at home;Long Term: Improve cardiorespiratory fitness, muscular endurance and strength as measured by increased METs and functional capacity (6MWT)       Able to understand and use rate of perceived exertion (RPE) scale Yes       Intervention Provide education and explanation on how to use RPE scale       Expected Outcomes Short Term: Able to use RPE daily in rehab to express subjective intensity level;Long Term:  Able to use RPE to guide intensity level when exercising independently       Able to understand and use Dyspnea scale Yes       Intervention Provide education and explanation on how to use Dyspnea scale       Expected Outcomes Short Term: Able to use Dyspnea scale daily in rehab to express subjective sense of shortness of breath during exertion;Long  Term: Able to use Dyspnea scale to guide intensity level when exercising independently       Knowledge and understanding of Target Heart Rate Range (THRR) Yes       Intervention Provide education and explanation of THRR including how the numbers were predicted and where they are located for reference       Expected Outcomes Short  Term: Able to state/look up THRR;Short Term: Able to use daily as guideline for intensity in rehab;Long Term: Able to use THRR to govern intensity when exercising independently       Able to check pulse independently Yes       Intervention Provide education and demonstration on how to check pulse in carotid and radial arteries.;Review the importance of being able to check your own pulse for safety during independent exercise       Expected Outcomes Short Term: Able to explain why pulse checking is important during independent exercise;Long Term: Able to check pulse independently and accurately       Understanding of Exercise Prescription Yes       Intervention Provide education, explanation, and written materials on patient's individual exercise prescription       Expected Outcomes Short Term: Able to explain program exercise prescription;Long Term: Able to explain home exercise prescription to exercise independently              Exercise Goals Re-Evaluation :  Exercise Goals Re-Evaluation    Row Name 11/01/20 [id number]             Exercise Goal Re-Evaluation   Exercise Goals Review Able to understand and use rate of perceived exertion (RPE) scale;Able to understand and use Dyspnea scale;Knowledge and understanding of Target Heart Rate Range (THRR);Understanding of Exercise Prescription       Comments Reviewed RPE and dyspnea scales, THR and program prescription with pt today.  Pt voiced understanding and was given a copy of goals to take home.       Expected Outcomes Short: Use RPE daily to regulate intensity. Long: Follow program prescription in THR.              Discharge Exercise Prescription (Final Exercise Prescription Changes):  Exercise Prescription Changes - 10/18/20 1600      Response to Exercise   Blood Pressure (Admit) 136/72    Blood Pressure (Exercise) 160/76    Blood Pressure (Exit) 134/60    Heart Rate (Admit) 75 bpm    Heart Rate (Exercise) 103 bpm    Heart Rate  (Exit) 89 bpm    Oxygen Saturation (Admit) 99 %    Oxygen Saturation (Exercise) 98 %    Rating of Perceived Exertion (Exercise) 12    Perceived Dyspnea (Exercise) 1    Symptoms none           Nutrition:  Target Goals: Understanding of nutrition guidelines, daily intake of sodium '1500mg'$ , cholesterol '200mg'$ , calories 30% from fat and 7% or less from saturated fats, daily to have 5 or more servings of fruits and vegetables.  Education: All About Nutrition: -Group instruction provided by verbal, written material, interactive activities, discussions, models, and posters to present general guidelines for heart healthy nutrition including fat, fiber, MyPlate, the role of sodium in heart healthy nutrition, utilization of the nutrition label, and utilization of this knowledge for meal planning. Follow up email sent as well. Written material given at graduation.   Biometrics:  Pre Biometrics - 10/18/20 1637      Pre Biometrics   Height  5' 10.75" (1.797 m)    Weight 260 lb 8 oz (118.2 kg)    BMI (Calculated) 36.59            Nutrition Therapy Plan and Nutrition Goals:   Nutrition Assessments:  MEDIFICTS Score Key:  ?70 Need to make dietary changes   40-70 Heart Healthy Diet  ? 40 Therapeutic Level Cholesterol Diet  Flowsheet Row Cardiac Rehab from 10/18/2020 in [facility] Cardiac and Pulmonary Rehab  Picture Your Plate Total Score on Admission 59     Picture Your Plate Scores:  <54 Unhealthy dietary pattern with much room for improvement.  41-50 Dietary pattern unlikely to meet recommendations for good health and room for improvement.  51-60 More healthful dietary pattern, with some room for improvement.   >60 Healthy dietary pattern, although there may be some specific behaviors that could be improved.    Nutrition Goals Re-Evaluation:   Nutrition Goals Discharge (Final Nutrition Goals Re-Evaluation):   Psychosocial: Target Goals: Acknowledge presence or absence of  significant depression and/or stress, maximize coping skills, provide positive support system. Participant is able to verbalize types and ability to use techniques and skills needed for reducing stress and depression.   Education: Stress, Anxiety, and Depression - Group verbal and visual presentation to define topics covered.  Reviews how body is impacted by stress, anxiety, and depression.  Also discusses healthy ways to reduce stress and to treat/manage anxiety and depression.  Written material given at graduation.   Education: Sleep Hygiene -Provides group verbal and written instruction about how sleep can affect your health.  Define sleep hygiene, discuss sleep cycles and impact of sleep habits. Review good sleep hygiene tips.    Initial Review & Psychosocial Screening:  Initial Psych Review & Screening - 10/13/20 1542      Initial Review   Current issues with History of Depression;Current Depression      Family Dynamics   Good Support System? Yes    Comments He can look to his wife and his three dogs.He has a posiive outlook on his health. Kathlene November takes medication for his mood and states that it helps.      Barriers   Psychosocial barriers to participate in program The patient should benefit from training in stress management and relaxation.      Screening Interventions   Interventions Program counselor consult;To provide support and resources with identified psychosocial needs;Encouraged to exercise;Provide feedback about the scores to participant           Quality of Life Scores:   Quality of Life - 10/18/20 1642      Quality of Life   Select Quality of Life      Quality of Life Scores   Health/Function Pre 20.57 %    Socioeconomic Pre 26.57 %    Psych/Spiritual Pre 30 %    Family Pre 28.5 %    GLOBAL Pre 24.94 %          Scores of 19 and below usually indicate a poorer quality of life in these areas.  A difference of  2-3 points is a clinically meaningful  difference.  A difference of 2-3 points in the total score of the Quality of Life Index has been associated with significant improvement in overall quality of life, self-image, physical symptoms, and general health in studies assessing change in quality of life.  PHQ-9: Recent Review Flowsheet Data    Depression screen [facility] 2/9 10/18/2020   Decreased Interest 0   Down, Depressed, Hopeless  0   PHQ - 2 Score 0   Altered sleeping 0   Tired, decreased energy 0   Change in appetite 0   Feeling bad or failure about yourself  0   Trouble concentrating 0   Moving slowly or fidgety/restless 0   Suicidal thoughts 0   PHQ-9 Score 0     Interpretation of Total Score  Total Score Depression Severity:  1-4 = Minimal depression, 5-9 = Mild depression, 10-14 = Moderate depression, 15-19 = Moderately severe depression, 20-27 = Severe depression   Psychosocial Evaluation and Intervention:  Psychosocial Evaluation - 10/13/20 1545      Psychosocial Evaluation & Interventions   Interventions Encouraged to exercise with the program and follow exercise prescription;Relaxation education;Stress management education    Comments He can look to his wife and his three dogs.He has a posiive outlook on his health. Ronalee Belts takes medication for his mood and states that it helps.    Expected Outcomes Short: Exercise regularly to support mental health and notify staff of any changes. Long: maintain mental health and well being through teaching of rehab or prescribed medications independently.    Continue Psychosocial Services  Follow up required by staff           Psychosocial Re-Evaluation:   Psychosocial Discharge (Final Psychosocial Re-Evaluation):   Vocational Rehabilitation: Provide vocational rehab assistance to qualifying candidates.   Vocational Rehab Evaluation & Intervention:   Education: Education Goals: Education classes will be provided on a variety of topics geared toward better understanding  of heart health and risk factor modification. Participant will state understanding/return demonstration of topics presented as noted by education test scores.  Learning Barriers/Preferences:  Learning Barriers/Preferences - 10/13/20 1541      Learning Barriers/Preferences   Learning Barriers None    Learning Preferences None           General Cardiac Education Topics:  AED/CPR: - Group verbal and written instruction with the use of models to demonstrate the basic use of the AED with the basic ABC's of resuscitation.   Anatomy and Cardiac Procedures: - Group verbal and visual presentation and models provide information about basic cardiac anatomy and function. Reviews the testing methods done to diagnose heart disease and the outcomes of the test results. Describes the treatment choices: Medical Management, Angioplasty, or Coronary Bypass Surgery for treating various heart conditions including Myocardial Infarction, Angina, Valve Disease, and Cardiac Arrhythmias.  Written material given at graduation.   Medication Safety: - Group verbal and visual instruction to review commonly prescribed medications for heart and lung disease. Reviews the medication, class of the drug, and side effects. Includes the steps to properly store meds and maintain the prescription regimen.  Written material given at graduation.   Intimacy: - Group verbal instruction through game format to discuss how heart and lung disease can affect sexual intimacy. Written material given at graduation..   Know Your Numbers and Heart Failure: - Group verbal and visual instruction to discuss disease risk factors for cardiac and pulmonary disease and treatment options.  Reviews associated critical values for Overweight/Obesity, Hypertension, Cholesterol, and Diabetes.  Discusses basics of heart failure: signs/symptoms and treatments.  Introduces Heart Failure Zone chart for action plan for heart failure.  Written material  given at graduation.   Infection Prevention: - Provides verbal and written material to individual with discussion of infection control including proper hand washing and proper equipment cleaning during exercise session. Flowsheet Row Cardiac Rehab from 10/18/2020 in [facility] Cardiac and Pulmonary  Rehab  Date 10/18/20  Educator AS  Instruction Review Code 1- Verbalizes Understanding      Falls Prevention: - Provides verbal and written material to individual with discussion of falls prevention and safety. Flowsheet Row Cardiac Rehab from 10/18/2020 in [facility] Cardiac and Pulmonary Rehab  Date 10/18/20  Educator AS  Instruction Review Code 1- Verbalizes Understanding      Other: -Provides group and verbal instruction on various topics (see comments)   Knowledge Questionnaire Score:  Knowledge Questionnaire Score - 10/18/20 1639      Knowledge Questionnaire Score   Pre Score 21/26 exercise nutrition           Core Components/Risk Factors/Patient Goals at Admission:  Personal Goals and Risk Factors at Admission - 10/18/20 1643      Core Components/Risk Factors/Patient Goals on Admission    Weight Management Weight Loss;Yes    Intervention Weight Management: Develop a combined nutrition and exercise program designed to reach desired caloric intake, while maintaining appropriate intake of nutrient and fiber, sodium and fats, and appropriate energy expenditure required for the weight goal.;Weight Management: Provide education and appropriate resources to help participant work on and attain dietary goals.;Weight Management/Obesity: Establish reasonable short term and long term weight goals.    Admit Weight 260 lb 8 oz (118.2 kg)    Goal Weight: Short Term 255 lb (115.7 kg)    Goal Weight: Long Term 225 lb (102.1 kg)    Expected Outcomes Long Term: Adherence to nutrition and physical activity/exercise program aimed toward attainment of established weight goal;Short Term: Continue to assess  and modify interventions until short term weight is achieved;Weight Loss: Understanding of general recommendations for a balanced deficit meal plan, which promotes 1-2 lb weight loss per week and includes a negative energy balance of [phone] kcal/d;Understanding recommendations for meals to include 15-35% energy as protein, 25-35% energy from fat, 35-60% energy from carbohydrates, less than $RemoveB'200mg'FmjTRlNH$  of dietary cholesterol, 20-35 gm of total fiber daily;Understanding of distribution of calorie intake throughout the day with the consumption of 4-5 meals/snacks    Diabetes Yes    Intervention Provide education about signs/symptoms and action to take for hypo/hyperglycemia.;Provide education about proper nutrition, including hydration, and aerobic/resistive exercise prescription along with prescribed medications to achieve blood glucose in normal ranges: Fasting glucose 65-99 mg/dL    Expected Outcomes Short Term: Participant verbalizes understanding of the signs/symptoms and immediate care of hyper/hypoglycemia, proper foot care and importance of medication, aerobic/resistive exercise and nutrition plan for blood glucose control.;Long Term: Attainment of HbA1C < 7%.    Hypertension Yes    Intervention Provide education on lifestyle modifcations including regular physical activity/exercise, weight management, moderate sodium restriction and increased consumption of fresh fruit, vegetables, and low fat dairy, alcohol moderation, and smoking cessation.;Monitor prescription use compliance.    Expected Outcomes Short Term: Continued assessment and intervention until BP is < 140/27mm HG in hypertensive participants. < 130/9mm HG in hypertensive participants with diabetes, heart failure or chronic kidney disease.;Long Term: Maintenance of blood pressure at goal levels.    Lipids Yes    Intervention Provide education and support for participant on nutrition & aerobic/resistive exercise along with prescribed medications  to achieve LDL '70mg'$ , HDL >$Remo'40mg'MDHNJ$ .    Expected Outcomes Short Term: Participant states understanding of desired cholesterol values and is compliant with medications prescribed. Participant is following exercise prescription and nutrition guidelines.;Long Term: Cholesterol controlled with medications as prescribed, with individualized exercise RX and with personalized nutrition plan. Value goals: LDL < $Rem'70mg'RtVC$ ,  HDL > 40 mg.           Education:Diabetes - Individual verbal and written instruction to review signs/symptoms of diabetes, desired ranges of glucose level fasting, after meals and with exercise. Acknowledge that pre and post exercise glucose checks will be done for 3 sessions at entry of program. [place] from 10/18/2020 in [facility] [facility] Cardiac and Pulmonary Rehab  Date 10/18/20  Educator AS  Instruction Review Code 1- Verbalizes Understanding      Core Components/Risk Factors/Patient Goals Review:    Core Components/Risk Factors/Patient Goals at Discharge (Final Review):    ITP Comments:  ITP Comments    Row Name 10/13/20 1540 10/18/20 1646 11/01/20 0836 11/03/20 0957     ITP Comments Virtual Visit completed. Patient informed on EP and RD appointment and 6 Minute walk test. Patient also informed of patient health questionnaires on My Chart. Patient Verbalizes understanding. Visit diagnosis can be found in [facility]12/22/2021. Completed 6MWT and gym orientation. Initial ITP created and sent for review to Dr. Emily Filbert, Medical Director. First full day of exercise!  Patient was oriented to gym and equipment including functions, settings, policies, and procedures.  Patient's individual exercise prescription and treatment plan were reviewed.  All starting workloads were established based on the results of the 6 minute walk test done at initial orientation visit.  The plan for exercise progression was also introduced and progression will be customized based on patient's performance and  goals. 30 Day review completed. Medical Director ITP review done, changes made as directed, and signed approval by Medical Director.  New to program           Comments:

## 2020-11-05 ENCOUNTER — Other Ambulatory Visit: Payer: Self-pay

## 2020-11-05 DIAGNOSIS — Z9861 Coronary angioplasty status: Secondary | ICD-10-CM | POA: Diagnosis not present

## 2020-11-05 LAB — GLUCOSE, CAPILLARY
Glucose-Capillary: 178 mg/dL — ABNORMAL HIGH (ref 70–99)
Glucose-Capillary: 134 mg/dL — ABNORMAL HIGH (ref 70–99)

## 2020-11-05 NOTE — Progress Notes (Signed)
Daily Session Note  Patient Details  Name: Derek Dickerson MRN: [id number] Date of Birth: 13-Sep-1961 Referring Provider:   Flowsheet Row Cardiac Rehab from 10/18/2020 in [facility] Cardiac and Pulmonary Rehab  Referring Provider [name]      Encounter Date: 11/05/2020  Check In:  Session Check In - 11/05/20 0746      Check-In   Supervising physician immediately available to respond to emergencies See telemetry face sheet for immediately available ER MD    Location [facility]-Cardiac & Pulmonary Rehab    Staff Present Birdie Sons, MPA, RN;Joseph Darrin Nipper, [place], RCEP, CCRP, CCET    Virtual Visit No    Medication changes reported     No    Fall or balance concerns reported    No    Warm-up and Cool-down Performed on first and last piece of equipment    Resistance Training Performed Yes    VAD Patient? No    PAD/SET Patient? No      Pain Assessment   Currently in Pain? No/denies              Social History   Tobacco Use  Smoking Status Former Smoker  . Packs/day: 1.00  . Years: 40.00  . Pack years: 40.00  . Types: Cigarettes  . Quit date: 2013  . Years since quitting: 9.0  Smokeless Tobacco Never Used    Goals Met:  Independence with exercise equipment Exercise tolerated well No report of cardiac concerns or symptoms Strength training completed today  Goals Unmet:  Not Applicable  Comments: Pt able to follow exercise prescription today without complaint.  Will continue to monitor for progression.    Dr. Emily Filbert is Medical Director for [place] and [facility].

## 2020-11-08 ENCOUNTER — Encounter: Payer: Medicare Other | Admitting: *Deleted

## 2020-11-08 ENCOUNTER — Other Ambulatory Visit: Payer: Self-pay

## 2020-11-08 DIAGNOSIS — Z9861 Coronary angioplasty status: Secondary | ICD-10-CM

## 2020-11-08 LAB — GLUCOSE, CAPILLARY
Glucose-Capillary: 153 mg/dL — ABNORMAL HIGH (ref 70–99)
Glucose-Capillary: 115 mg/dL — ABNORMAL HIGH (ref 70–99)

## 2020-11-08 NOTE — Progress Notes (Signed)
Daily Session Note  Patient Details  Name: Derek Dickerson MRN: [id number] Date of Birth: November 12, 1960 Referring Provider:   Flowsheet Row Cardiac Rehab from 10/18/2020 in [facility] Cardiac and Pulmonary Rehab  Referring Provider [name]      Encounter Date: 11/08/2020  Check In:  Session Check In - 11/08/20 0829      Check-In   Supervising physician immediately available to respond to emergencies See telemetry face sheet for immediately available ER MD    Location [facility]-Cardiac & Pulmonary Rehab    Staff Present Earlean Shawl, BS, ACSM CEP, Exercise Physiologist;Joseph Flavia Shipper;Heath Lark, RN, BSN, CCRP    Virtual Visit No    Medication changes reported     No    Fall or balance concerns reported    No    Warm-up and Cool-down Performed on first and last piece of equipment    Resistance Training Performed Yes    VAD Patient? No    PAD/SET Patient? No      Pain Assessment   Currently in Pain? No/denies              Social History   Tobacco Use  Smoking Status Former Smoker  . Packs/day: 1.00  . Years: 40.00  . Pack years: 40.00  . Types: Cigarettes  . Quit date: 2013  . Years since quitting: 9.0  Smokeless Tobacco Never Used    Goals Met:  Independence with exercise equipment Exercise tolerated well No report of cardiac concerns or symptoms  Goals Unmet:  Not Applicable  Comments: Pt able to follow exercise prescription today without complaint.  Will continue to monitor for progression.    Dr. Emily Filbert is Medical Director for [place] and [facility].

## 2020-11-08 NOTE — Progress Notes (Signed)
Completed initial RD evaluation 

## 2020-11-10 ENCOUNTER — Encounter: Payer: Medicare Other | Attending: Cardiology

## 2020-11-10 ENCOUNTER — Other Ambulatory Visit: Payer: Self-pay

## 2020-11-10 DIAGNOSIS — Z9861 Coronary angioplasty status: Secondary | ICD-10-CM | POA: Insufficient documentation

## 2020-11-10 NOTE — Progress Notes (Signed)
Daily Session Note  Patient Details  Name: [name] MRN: [id number] Date of Birth: 26-Nov-1960 Referring Provider:   Flowsheet Row Card[facility] Re[facility]18[facility] Card[facility] Re[facility]  Referring Provider [name]      Encounter Date: 11/10/2020  Check In:  Session Check In - 11/10/20 0835      Check-In   Supervising physician immediately available to respond to emergencies See telemetry face sheet for immediately available ER MD    Location [facility]-Cardiac & Pulmonary Rehab    Staff Present Birdie Sons, MPA, Elveria Rising, BA, ACSM CEP, Exercise Physiologist;Joseph Tessie Fass RCP,RRT,BSRT    Virtual Visit No    Medication changes reported     No    Fall or balance concerns reported    No    Warm-up and Cool-down Performed on first and last piece of equipment    Resistance Training Performed Yes    VAD Patient? No    PAD/SET Patient? No      Pain Assessment   Currently in Pain? No/denies             Exercise Prescription Changes - 11/10/20 0800      Home Exercise Plan   Plans to continue exercise at [facility]    Frequency Add 2 additional days to program exercise sessions.    Initial Home Exercises Provided 11/10/20           Social History   Tobacco Use  Smoking Status Former Smoker  . Packs/day: 1.00  . Years: 40.00  . Pack years: 40.00  . Types: Cigarettes  . Quit date: 2013  . Years since quitting: 9.0  Smokeless Tobacco Never Used    Goals Met:  Independence with exercise equipment Exercise tolerated well No report of cardiac concerns or symptoms Strength training completed today  Goals Unmet:  Not Applicable  Comments: Pt able to follow exercise prescription today without complaint.  Will continue to monitor for progression.    Dr. Emily Filbert is Medical Director for [place] and [facility].

## 2020-11-10 NOTE — Progress Notes (Signed)
Daily Session Note  Patient Details  Name: Derek Dickerson MRN: [id number] Date of Birth: [birth year] Referring Provider:   Flowsheet Row Cardiac Rehab from 10/18/2020 in [facility] Cardiac and Pulmonary Rehab  Referring Provider [name]      Encounter Date: 11/10/2020  Check In:      Social History   Tobacco Use  Smoking Status Former Smoker  . Packs/day: 1.00  . Years: 40.00  . Pack years: 40.00  . Types: Cigarettes  . Quit date: 2013  . Years since quitting: 9.0  Smokeless Tobacco Never Used    Goals Met:  Independence with exercise equipment Exercise tolerated well Personal goals reviewed Strength training completed today  Goals Unmet:  Not Applicable  Comments: Pt able to follow exercise prescription today without complaint.  Will continue to monitor for progression. Reviewed home exercise with pt today.  Pt plans to walk and use stationary bike/join [employer] for exercise.  Reviewed THR, pulse, RPE, sign and symptoms, pulse oximetery and when to call 911 or MD.  Also discussed weather considerations and indoor options.  Pt voiced understanding.    Dr. Emily Filbert is Medical Director for [place] and [facility].

## 2020-11-12 ENCOUNTER — Other Ambulatory Visit: Payer: Self-pay

## 2020-11-12 DIAGNOSIS — Z9861 Coronary angioplasty status: Secondary | ICD-10-CM

## 2020-11-12 NOTE — Progress Notes (Signed)
Daily Session Note  Patient Details  Name: Derek Dickerson MRN: [id number] Date of Birth: 1961-02-06 Referring Provider:   Flowsheet Row Cardiac Rehab from 10/18/2020 in [facility] Cardiac and Pulmonary Rehab  Referring Provider [name]      Encounter Date: 11/12/2020  Check In:  Session Check In - 11/12/20 0743      Check-In   Supervising physician immediately available to respond to emergencies See telemetry face sheet for immediately available ER MD    Location [facility]-Cardiac & Pulmonary Rehab    Staff Present Birdie Sons, MPA, RN;Joseph Darrin Nipper, [place], RCEP, CCRP, CCET    Virtual Visit No    Medication changes reported     No    Fall or balance concerns reported    No    Warm-up and Cool-down Performed on first and last piece of equipment    Resistance Training Performed Yes    VAD Patient? No    PAD/SET Patient? No      Pain Assessment   Currently in Pain? No/denies              Social History   Tobacco Use  Smoking Status Former Smoker  . Packs/day: 1.00  . Years: 40.00  . Pack years: 40.00  . Types: Cigarettes  . Quit date: 2013  . Years since quitting: 9.0  Smokeless Tobacco Never Used    Goals Met:  Independence with exercise equipment Exercise tolerated well No report of cardiac concerns or symptoms Strength training completed today  Goals Unmet:  Not Applicable  Comments: Pt able to follow exercise prescription today without complaint.  Will continue to monitor for progression.    Dr. Emily Filbert is Medical Director for [place] and [facility].

## 2020-12-01 ENCOUNTER — Encounter: Payer: Self-pay | Admitting: *Deleted

## 2020-12-01 DIAGNOSIS — Z9861 Coronary angioplasty status: Secondary | ICD-10-CM

## 2020-12-01 NOTE — Progress Notes (Signed)
Cardiac Individual Treatment Plan  Patient Details  Name: Derek Dickerson MRN: [id number] Date of Birth: [birth year] Referring Provider:   Flowsheet Row Cardiac Rehab from 10/18/2020 in [facility] Cardiac and Pulmonary Rehab  Referring Provider [name]      Initial Encounter Date:  Flowsheet Row Cardiac Rehab from 10/18/2020 in [facility] Cardiac and Pulmonary Rehab  Date 10/18/20      Visit Diagnosis: S/P PTCA (percutaneous transluminal coronary angioplasty)  Patient's Home Medications on Admission:  Current Outpatient Medications:  .  aspirin 81 MG EC tablet, Take by mouth., Disp: , Rfl:  .  Blood Glucose Monitoring Suppl (FIFTY50 GLUCOSE METER 2.0) w/Device KIT, Tests 8 times a day, One touch Ultra 2 glucometer, E08.9, Disp: , Rfl:  .  buPROPion (WELLBUTRIN XL) 300 MG 24 hr tablet, Take by mouth., Disp: , Rfl:  .  cetirizine-pseudoephedrine (ZYRTEC-D) 5-120 MG tablet, Take by mouth., Disp: , Rfl:  .  Cholecalciferol 25 MCG (1000 UT) tablet, Take by mouth., Disp: , Rfl:  .  clopidogrel (PLAVIX) 75 MG tablet, Take by mouth., Disp: , Rfl:  .  EPINEPHrine 0.3 mg/0.3 mL IJ SOAJ injection, Inject into the muscle., Disp: , Rfl:  .  ferrous sulfate 325 (65 FE) MG tablet, Take by mouth., Disp: , Rfl:  .  gabapentin (NEURONTIN) 100 MG capsule, Take by mouth., Disp: , Rfl:  .  glucose blood (PRECISION QID TEST) test strip, Use one. 5 (five) to 8 (eight) times daily as needed. Use as instructed. ONE TOUCH ULTRA, Disp: , Rfl:  .  insulin glargine (LANTUS) 100 UNIT/ML Solostar Pen, Inject into the skin., Disp: , Rfl:  .  insulin lispro (HUMALOG) 100 UNIT/ML KwikPen, INJECT 20-30 UNITS WITH MEALS OR SNACKS UP TO FIVE TIMES DAILY (MAX OF 140 UNITS PER DAY), Disp: , Rfl:  .  Insulin Pen Needle (B-D UF III MINI PEN NEEDLES) 31G X 5 MM MISC, USE WITH INSULIN 7 TIMES DAILY, Disp: , Rfl:  .  omeprazole (PRILOSEC) 40 MG capsule, Take by mouth., Disp: , Rfl:  .  oxyCODONE (OXY IR/ROXICODONE) 5 MG immediate release  tablet, Take by mouth., Disp: , Rfl:  .  Pancrelipase, Lip-Prot-Amyl, (CREON) 24000-76000 units CPEP, Take by mouth., Disp: , Rfl:  .  rosuvastatin (CRESTOR) 20 MG tablet, Take by mouth., Disp: , Rfl:   Past Medical History: Past Medical History:  Diagnosis Date  . Diabetes mellitus without complication ([place])     Tobacco Use: Social History   Tobacco Use  Smoking Status Former Smoker  . Packs/day: 1.00  . Years: 40.00  . Pack years: 40.00  . Types: Cigarettes  . Quit date: 2013  . Years since quitting: 9.1  Smokeless Tobacco Never Used    Labs: Recent Review Flowsheet Data   There is no flowsheet data to display.      Exercise Target Goals: Exercise Program Goal: Individual exercise prescription set using results from initial 6 min walk test and THRR while considering  patient's activity barriers and safety.   Exercise Prescription Goal: Initial exercise prescription builds to 30-45 minutes a day of aerobic activity, 2-3 days per week.  Home exercise guidelines will be given to patient during program as part of exercise prescription that the participant will acknowledge.   Education: Aerobic Exercise: - Group verbal and visual presentation on the components of exercise prescription. Introduces F.I.T.T principle from ACSM for exercise prescriptions.  Reviews F.I.T.T. principles of aerobic exercise including progression. Written material given at graduation.   Education: Resistance Exercise: -  Group verbal and visual presentation on the components of exercise prescription. Introduces F.I.T.T principle from ACSM for exercise prescriptions  Reviews F.I.T.T. principles of resistance exercise including progression. Written material given at graduation.    Education: Exercise & Equipment Safety: - Individual verbal instruction and demonstration of equipment use and safety with use of the equipment. Flowsheet Row Cardiac Rehab from 11/10/2020 in [facility] Cardiac and Pulmonary Rehab   Date 10/18/20  Educator AS  Instruction Review Code 1- Verbalizes Understanding      Education: Exercise Physiology & General Exercise Guidelines: - Group verbal and written instruction with models to review the exercise physiology of the cardiovascular system and associated critical values. Provides general exercise guidelines with specific guidelines to those with heart or lung disease.    Education: Flexibility, Balance, Mind/Body Relaxation: - Group verbal and visual presentation with interactive activity on the components of exercise prescription. Introduces F.I.T.T principle from ACSM for exercise prescriptions. Reviews F.I.T.T. principles of flexibility and balance exercise training including progression. Also discusses the mind body connection.  Reviews various relaxation techniques to help reduce and manage stress (i.e. Deep breathing, progressive muscle relaxation, and visualization). Balance handout provided to take home. Written material given at graduation.   Activity Barriers & Risk Stratification:   6 Minute Walk:  6 Minute Walk    Row Name 10/18/20 1634         6 Minute Walk   Phase Initial     Distance 1400 feet     Walk Time 6 minutes     # of Rest Breaks 0     MPH 2.65     METS 3.3     RPE 12     Perceived Dyspnea  1     VO2 Peak 11.5     Symptoms No     Resting HR 75 bpm     Resting BP 136/72     Resting Oxygen Saturation  99 %     Exercise Oxygen Saturation  during 6 min walk 98 %     Max Ex. HR 103 bpm     Max Ex. BP 160/76     2 Minute Post BP 134/60            Oxygen Initial Assessment:   Oxygen Re-Evaluation:   Oxygen Discharge (Final Oxygen Re-Evaluation):   Initial Exercise Prescription:  Initial Exercise Prescription - 10/18/20 1600      Date of Initial Exercise RX and Referring Provider   Date 10/18/20    Referring Provider [name]      Treadmill   MPH 2.5    Grade 0.5    Minutes 15    METs 3.3      REL-XR   Level 3     Speed 50    Minutes 15    METs 3.3      T5 Nustep   Level 2    SPM 80    Minutes 15    METs 3.3      Prescription Details   Frequency (times per week) 3    Duration Progress to 30 minutes of continuous aerobic without signs/symptoms of physical distress      Intensity   THRR 40-80% of Max Heartrate 109-142    Ratings of Perceived Exertion 11-15    Perceived Dyspnea 0-4      Resistance Training   Training Prescription Yes    Weight 4 lb    Reps 10-15  Perform Capillary Blood Glucose checks as needed.  Exercise Prescription Changes:  Exercise Prescription Changes    Row Name 10/18/20 1600 11/08/20 1600 11/10/20 0800 11/23/20 1500       Response to Exercise   Blood Pressure (Admit) 136/72 130/82 -- 136/56    Blood Pressure (Exercise) 160/76 140/78 -- 144/70    Blood Pressure (Exit) 134/60 142/86 -- 122/60    Heart Rate (Admit) 75 bpm 76 bpm -- 75 bpm    Heart Rate (Exercise) 103 bpm 96 bpm -- 97 bpm    Heart Rate (Exit) 89 bpm 72 bpm -- 73 bpm    Oxygen Saturation (Admit) 99 % -- -- --    Oxygen Saturation (Exercise) 98 % -- -- --    Rating of Perceived Exertion (Exercise) 12 13 -- 14    Perceived Dyspnea (Exercise) 1 -- -- --    Symptoms none none -- none    Comments -- -- -- from visit on 11/12/20    Duration -- Continue with 30 min of aerobic exercise without signs/symptoms of physical distress. -- Continue with 30 min of aerobic exercise without signs/symptoms of physical distress.    Intensity -- THRR unchanged -- THRR unchanged         Progression   Progression -- Continue to progress workloads to maintain intensity without signs/symptoms of physical distress. -- Continue to progress workloads to maintain intensity without signs/symptoms of physical distress.    Average METs -- 2.53 -- 2.63         Resistance Training   Training Prescription -- Yes -- Yes    Weight -- 5 lb -- 5 lb    Reps -- 10-15 -- 10-15         Interval Training    Interval Training -- No -- No         Treadmill   MPH -- 2.5 -- 2.6    Grade -- 0.5 -- 0.5    Minutes -- 15 -- 15    METs -- 3.09 -- 3.17         REL-XR   Level -- 3 -- --    Minutes -- 15 -- --    METs -- 2.3 -- --         T5 Nustep   Level -- 2 -- 3    Minutes -- 15 -- 15    METs -- 2.2 -- 2.1         Home Exercise Plan   Plans to continue exercise at -- -- [place]    Frequency -- -- Add 2 additional days to program exercise sessions. Add 2 additional days to program exercise sessions.    Initial Home Exercises Provided -- -- 11/10/20 11/10/20           Exercise Comments:  Exercise Comments    Row Name 11/01/20 [phone]           Exercise Comments First full day of exercise!  Patient was oriented to gym and equipment including functions, settings, policies, and procedures.  Patient's individual exercise prescription and treatment plan were reviewed.  All starting workloads were established based on the results of the 6 minute walk test done at initial orientation visit.  The plan for exercise progression was also introduced and progression will be customized based on patient's performance and goals.              Exercise Goals and Review:  Exercise Goals  [place] Name 10/18/20 1637             Exercise Goals   Increase Physical Activity Yes       Intervention Provide advice, education, support and counseling about physical activity/exercise needs.;Develop an individualized exercise prescription for aerobic and resistive training based on initial evaluation findings, risk stratification, comorbidities and participant's personal goals.       Expected Outcomes Short Term: Attend rehab on a regular basis to increase amount of physical activity.;Long Term: Add in home exercise to make exercise part of routine and to increase amount of physical activity.;Long Term: Exercising regularly at least 3-5 days a week.       Increase Strength and  Stamina Yes       Intervention Provide advice, education, support and counseling about physical activity/exercise needs.       Expected Outcomes Short Term: Increase workloads from initial exercise prescription for resistance, speed, and METs.;Short Term: Perform resistance training exercises routinely during rehab and add in resistance training at home;Long Term: Improve cardiorespiratory fitness, muscular endurance and strength as measured by increased METs and functional capacity (6MWT)       Able to understand and use rate of perceived exertion (RPE) scale Yes       Intervention Provide education and explanation on how to use RPE scale       Expected Outcomes Short Term: Able to use RPE daily in rehab to express subjective intensity level;Long Term:  Able to use RPE to guide intensity level when exercising independently       Able to understand and use Dyspnea scale Yes       Intervention Provide education and explanation on how to use Dyspnea scale       Expected Outcomes Short Term: Able to use Dyspnea scale daily in rehab to express subjective sense of shortness of breath during exertion;Long Term: Able to use Dyspnea scale to guide intensity level when exercising independently       Knowledge and understanding of Target Heart Rate Range (THRR) Yes       Intervention Provide education and explanation of THRR including how the numbers were predicted and where they are located for reference       Expected Outcomes Short Term: Able to state/look up THRR;Short Term: Able to use daily as guideline for intensity in rehab;Long Term: Able to use THRR to govern intensity when exercising independently       Able to check pulse independently Yes       Intervention Provide education and demonstration on how to check pulse in carotid and radial arteries.;Review the importance of being able to check your own pulse for safety during independent exercise       Expected Outcomes Short Term: Able to explain why  pulse checking is important during independent exercise;Long Term: Able to check pulse independently and accurately       Understanding of Exercise Prescription Yes       Intervention Provide education, explanation, and written materials on patient's individual exercise prescription       Expected Outcomes Short Term: Able to explain program exercise prescription;Long Term: Able to explain home exercise prescription to exercise independently              Exercise Goals Re-Evaluation :  Exercise Goals Re-Evaluation    Row Name 11/01/20 0836 11/08/20 1629 11/10/20 0829 11/23/20 1543       Exercise Goal Re-Evaluation   Exercise Goals Review Able to understand and use  rate of perceived exertion (RPE) scale;Able to understand and use Dyspnea scale;Knowledge and understanding of Target Heart Rate Range (THRR);Understanding of Exercise Prescription Increase Physical Activity;Increase Strength and Stamina;Understanding of Exercise Prescription Increase Physical Activity;Increase Strength and Stamina;Able to understand and use rate of perceived exertion (RPE) scale;Knowledge and understanding of Target Heart Rate Range (THRR);Able to check pulse independently;Understanding of Exercise Prescription Increase Physical Activity;Increase Strength and Stamina;Understanding of Exercise Prescription    Comments Reviewed RPE and dyspnea scales, THR and program prescription with pt today.  Pt voiced understanding and was given a copy of goals to take home. Derek Dickerson is off to a good start in rehab.  He has completed his first three full days of exercise already.  We will continue to monitor his progress. Reviewed home exercise with pt today.  Pt plans to walk and use stationary bike for exercise.  Reviewed THR, pulse, RPE, sign and symptoms, pulse oximetery and when to call 911 or MD.  Also discussed weather considerations and indoor options.  Pt voiced understanding. Only attended once since last review.  Currently in  hospital    Expected Outcomes Short: Use RPE daily to regulate intensity. Long: Follow program prescription in THR. Short: Continue to attend rehab regularly  Long: Continue to follow program prescription. Short: add 2 days of exercise outside program sessions Long:  improve overall MET level Short: Clearance to return once discharged from hospital Long: Return to regular attendance           Discharge Exercise Prescription (Final Exercise Prescription Changes):  Exercise Prescription Changes - 11/23/20 1500      Response to Exercise   Blood Pressure (Admit) 136/56    Blood Pressure (Exercise) 144/70    Blood Pressure (Exit) 122/60    Heart Rate (Admit) 75 bpm    Heart Rate (Exercise) 97 bpm    Heart Rate (Exit) 73 bpm    Rating of Perceived Exertion (Exercise) 14    Symptoms none    Comments from visit on 11/12/20    Duration Continue with 30 min of aerobic exercise without signs/symptoms of physical distress.    Intensity THRR unchanged      Progression   Progression Continue to progress workloads to maintain intensity without signs/symptoms of physical distress.    Average METs 2.63      Resistance Training   Training Prescription Yes    Weight 5 lb    Reps 10-15      Interval Training   Interval Training No      Treadmill   MPH 2.6    Grade 0.5    Minutes 15    METs 3.17      T5 Nustep   Level 3    Minutes 15    METs 2.1      Home Exercise Plan   Plans to continue exercise at [facility]    Frequency Add 2 additional days to program exercise sessions.    Initial Home Exercises Provided 11/10/20           Nutrition:  Target Goals: Understanding of nutrition guidelines, daily intake of sodium <1541m, cholesterol <2071m calories 30% from fat and 7% or less from saturated fats, daily to have 5 or more servings of fruits and vegetables.  Education: All About Nutrition: -Group instruction provided by verbal, written material, interactive activities,  discussions, models, and posters to present general guidelines for heart healthy nutrition including fat, fiber, MyPlate, the role of sodium in heart healthy nutrition, utilization  of the nutrition label, and utilization of this knowledge for meal planning. Follow up email sent as well. Written material given at graduation.   Biometrics:  Pre Biometrics - 10/18/20 1637      Pre Biometrics   Height 5' 10.75" (1.797 m)    Weight 260 lb 8 oz (118.2 kg)    BMI (Calculated) 36.59            Nutrition Therapy Plan and Nutrition Goals:  Nutrition Therapy & Goals - 11/08/20 1047      Nutrition Therapy   Diet Heart healthy, low Na, diabetes friendly    Drug/Food Interactions Statins/Certain Fruits    Protein (specify units) 95g    Fiber 30 grams    Whole Grain Foods 3 servings    Saturated Fats 12 max. grams    Fruits and Vegetables 8 servings/day    Sodium 1.5 grams      Personal Nutrition Goals   Nutrition Goal ST: prepare lunches, prepare dinner 3x/week LT: cook/prepare most meals at home    Comments A1C 8 (January 2022). Insulin - short acting and long lasting. When his BG gets too low he uses orange juice. He feels he eats too much fatty foods. B: 3 tbsp - vanilla yogurt with 1/2 cup granola L: frozen meals D: fast food, frozen foods S: potato chips Drinks: diet soda and sweet tea (splenda). Discussed heart healthy eating and diabetes friendly eating. He feels he does not have a good diet and would like to start preparing his own meals as he feels that was the barrier for him to eat healthier.      Intervention Plan   Intervention Prescribe, educate and counsel regarding individualized specific dietary modifications aiming towards targeted core components such as weight, hypertension, lipid management, diabetes, heart failure and other comorbidities.;Nutrition handout(s) given to patient.    Expected Outcomes Short Term Goal: Understand basic principles of dietary content, such as  calories, fat, sodium, cholesterol and nutrients.;Long Term Goal: Adherence to prescribed nutrition plan.;Short Term Goal: A plan has been developed with personal nutrition goals set during dietitian appointment.           Nutrition Assessments:  MEDIFICTS Score Key:  ?70 Need to make dietary changes   40-70 Heart Healthy Diet  ? 40 Therapeutic Level Cholesterol Diet  Flowsheet Row Cardiac Rehab from 10/18/2020 in [facility] Cardiac and Pulmonary Rehab  Picture Your Plate Total Score on Admission 59     Picture Your Plate Scores:  <53 Unhealthy dietary pattern with much room for improvement.  41-50 Dietary pattern unlikely to meet recommendations for good health and room for improvement.  51-60 More healthful dietary pattern, with some room for improvement.   >60 Healthy dietary pattern, although there may be some specific behaviors that could be improved.    Nutrition Goals Re-Evaluation:   Nutrition Goals Discharge (Final Nutrition Goals Re-Evaluation):   Psychosocial: Target Goals: Acknowledge presence or absence of significant depression and/or stress, maximize coping skills, provide positive support system. Participant is able to verbalize types and ability to use techniques and skills needed for reducing stress and depression.   Education: Stress, Anxiety, and Depression - Group verbal and visual presentation to define topics covered.  Reviews how body is impacted by stress, anxiety, and depression.  Also discusses healthy ways to reduce stress and to treat/manage anxiety and depression.  Written material given at graduation. Flowsheet Row Cardiac Rehab from 11/10/2020 in [facility] Cardiac and Pulmonary Rehab  Date 11/10/20  Educator  [facility]  Instruction Review Code 1- Verbalizes Understanding      Education: Sleep Hygiene -Provides group verbal and written instruction about how sleep can affect your health.  Define sleep hygiene, discuss sleep cycles and impact of sleep habits.  Review good sleep hygiene tips.    Initial Review & Psychosocial Screening:  Initial Psych Review & Screening - 10/13/20 1542      Initial Review   Current issues with History of Depression;Current Depression      Family Dynamics   Good Support System? Yes    Comments He can look to his wife and his three dogs.He has a posiive outlook on his health. Derek Dickerson takes medication for his mood and states that it helps.      Barriers   Psychosocial barriers to participate in program The patient should benefit from training in stress management and relaxation.      Screening Interventions   Interventions Program counselor consult;To provide support and resources with identified psychosocial needs;Encouraged to exercise;Provide feedback about the scores to participant           Quality of Life Scores:   Quality of Life - 10/18/20 1642      Quality of Life   Select Quality of Life      Quality of Life Scores   Health/Function Pre 20.57 %    Socioeconomic Pre 26.57 %    Psych/Spiritual Pre 30 %    Family Pre 28.5 %    GLOBAL Pre 24.94 %          Scores of 19 and below usually indicate a poorer quality of life in these areas.  A difference of  2-3 points is a clinically meaningful difference.  A difference of 2-3 points in the total score of the Quality of Life Index has been associated with significant improvement in overall quality of life, self-image, physical symptoms, and general health in studies assessing change in quality of life.  PHQ-9: Recent Review Flowsheet Data    Depression screen [facility] 2/9 10/18/2020   Decreased Interest 0   Down, Depressed, Hopeless 0   PHQ - 2 Score 0   Altered sleeping 0   Tired, decreased energy 0   Change in appetite 0   Feeling bad or failure about yourself  0   Trouble concentrating 0   Moving slowly or fidgety/restless 0   Suicidal thoughts 0   PHQ-9 Score 0     Interpretation of Total Score  Total Score Depression Severity:  1-4 =  Minimal depression, 5-9 = Mild depression, 10-14 = Moderate depression, 15-19 = Moderately severe depression, 20-27 = Severe depression   Psychosocial Evaluation and Intervention:  Psychosocial Evaluation - 10/13/20 1545      Psychosocial Evaluation & Interventions   Interventions Encouraged to exercise with the program and follow exercise prescription;Relaxation education;Stress management education    Comments He can look to his wife and his three dogs.He has a posiive outlook on his health. Derek Dickerson takes medication for his mood and states that it helps.    Expected Outcomes Short: Exercise regularly to support mental health and notify staff of any changes. Long: maintain mental health and well being through teaching of rehab or prescribed medications independently.    Continue Psychosocial Services  Follow up required by staff           Psychosocial Re-Evaluation:  Psychosocial Re-Evaluation    [place] Name 11/10/20 [phone]  Psychosocial Re-Evaluation   Comments Derek Dickerson reports no stress, depression or anxiety symptoms.  He sleeps well most of the time.       Expected Outcomes Short: continue to attend [facility] to help manage  stress/sleep Long: maintain positive outlook              Psychosocial Discharge (Final Psychosocial Re-Evaluation):  Psychosocial Re-Evaluation - 11/10/20 0816      Psychosocial Re-Evaluation   Comments Derek Dickerson reports no stress, depression or anxiety symptoms.  He sleeps well most of the time.    Expected Outcomes Short: continue to attend [facility] to help manage  stress/sleep Long: maintain positive outlook           Vocational Rehabilitation: Provide vocational rehab assistance to qualifying candidates.   Vocational Rehab Evaluation & Intervention:   Education: Education Goals: Education classes will be provided on a variety of topics geared toward better understanding of heart health and risk factor modification. Participant will state  understanding/return demonstration of topics presented as noted by education test scores.  Learning Barriers/Preferences:  Learning Barriers/Preferences - 10/13/20 1541      Learning Barriers/Preferences   Learning Barriers None    Learning Preferences None           General Cardiac Education Topics:  AED/CPR: - Group verbal and written instruction with the use of models to demonstrate the basic use of the AED with the basic ABC's of resuscitation.   Anatomy and Cardiac Procedures: - Group verbal and visual presentation and models provide information about basic cardiac anatomy and function. Reviews the testing methods done to diagnose heart disease and the outcomes of the test results. Describes the treatment choices: Medical Management, Angioplasty, or Coronary Bypass Surgery for treating various heart conditions including Myocardial Infarction, Angina, Valve Disease, and Cardiac Arrhythmias.  Written material given at graduation.   Medication Safety: - Group verbal and visual instruction to review commonly prescribed medications for heart and lung disease. Reviews the medication, class of the drug, and side effects. Includes the steps to properly store meds and maintain the prescription regimen.  Written material given at graduation.   Intimacy: - Group verbal instruction through game format to discuss how heart and lung disease can affect sexual intimacy. Written material given at graduation..   Know Your Numbers and Heart Failure: - Group verbal and visual instruction to discuss disease risk factors for cardiac and pulmonary disease and treatment options.  Reviews associated critical values for Overweight/Obesity, Hypertension, Cholesterol, and Diabetes.  Discusses basics of heart failure: signs/symptoms and treatments.  Introduces Heart Failure Zone chart for action plan for heart failure.  Written material given at graduation.   Infection Prevention: - Provides verbal and  written material to individual with discussion of infection control including proper hand washing and proper equipment cleaning during exercise session. Flowsheet Row Cardiac Rehab from 11/10/2020 in [facility] Cardiac and Pulmonary Rehab  Date 10/18/20  Educator AS  Instruction Review Code 1- Verbalizes Understanding      Falls Prevention: - Provides verbal and written material to individual with discussion of falls prevention and safety. Flowsheet Row Cardiac Rehab from 11/10/2020 in [facility] Cardiac and Pulmonary Rehab  Date 10/18/20  Educator AS  Instruction Review Code 1- Verbalizes Understanding      Other: -Provides group and verbal instruction on various topics (see comments)   Knowledge Questionnaire Score:  Knowledge Questionnaire Score - 10/18/20 1639      Knowledge Questionnaire Score   Pre Score 21/26 exercise  nutrition           Core Components/Risk Factors/Patient Goals at Admission:  Personal Goals and Risk Factors at Admission - 10/18/20 1643      Core Components/Risk Factors/Patient Goals on Admission    Weight Management Weight Loss;Yes    Intervention Weight Management: Develop a combined nutrition and exercise program designed to reach desired caloric intake, while maintaining appropriate intake of nutrient and fiber, sodium and fats, and appropriate energy expenditure required for the weight goal.;Weight Management: Provide education and appropriate resources to help participant work on and attain dietary goals.;Weight Management/Obesity: Establish reasonable short term and long term weight goals.    Admit Weight 260 lb 8 oz (118.2 kg)    Goal Weight: Short Term 255 lb (115.7 kg)    Goal Weight: Long Term 225 lb (102.1 kg)    Expected Outcomes Long Term: Adherence to nutrition and physical activity/exercise program aimed toward attainment of established weight goal;Short Term: Continue to assess and modify interventions until short term weight is achieved;Weight Loss:  Understanding of general recommendations for a balanced deficit meal plan, which promotes 1-2 lb weight loss per week and includes a negative energy balance of [phone] kcal/d;Understanding recommendations for meals to include 15-35% energy as protein, 25-35% energy from fat, 35-60% energy from carbohydrates, less than 245m of dietary cholesterol, 20-35 gm of total fiber daily;Understanding of distribution of calorie intake throughout the day with the consumption of 4-5 meals/snacks    Diabetes Yes    Intervention Provide education about signs/symptoms and action to take for hypo/hyperglycemia.;Provide education about proper nutrition, including hydration, and aerobic/resistive exercise prescription along with prescribed medications to achieve blood glucose in normal ranges: Fasting glucose 65-99 mg/dL    Expected Outcomes Short Term: Participant verbalizes understanding of the signs/symptoms and immediate care of hyper/hypoglycemia, proper foot care and importance of medication, aerobic/resistive exercise and nutrition plan for blood glucose control.;Long Term: Attainment of HbA1C < 7%.    Hypertension Yes    Intervention Provide education on lifestyle modifcations including regular physical activity/exercise, weight management, moderate sodium restriction and increased consumption of fresh fruit, vegetables, and low fat dairy, alcohol moderation, and smoking cessation.;Monitor prescription use compliance.    Expected Outcomes Short Term: Continued assessment and intervention until BP is < 140/981mHG in hypertensive participants. < 130/8025mG in hypertensive participants with diabetes, heart failure or chronic kidney disease.;Long Term: Maintenance of blood pressure at goal levels.    Lipids Yes    Intervention Provide education and support for participant on nutrition & aerobic/resistive exercise along with prescribed medications to achieve LDL <59m24mDL >40mg72m Expected Outcomes Short Term:  Participant states understanding of desired cholesterol values and is compliant with medications prescribed. Participant is following exercise prescription and nutrition guidelines.;Long Term: Cholesterol controlled with medications as prescribed, with individualized exercise RX and with personalized nutrition plan. Value goals: LDL < 59mg,82m > 40 mg.           Education:Diabetes - Individual verbal and written instruction to review signs/symptoms of diabetes, desired ranges of glucose level fasting, after meals and with exercise. Acknowledge that pre and post exercise glucose checks will be done for 3 sessions at entry of program. [place]  Date 10/18/20  Educator AS  Instruction Review Code 1- Verbalizes Understanding      Core Components/Risk Factors/Patient Goals Review:   Goals and Risk Factor Review    Row Name 11/10/20  0809             Core Components/Risk Factors/Patient Goals Review   Personal Goals Review Weight Management/Obesity;Lipids;Diabetes;Hypertension       Review Derek Dickerson is taking all medications as directed.  He does check BP and BG at home.  Today FBG was 126.  He has met with RD and is watching portion sizes.  He will have blood work to recheck cholesterol etc in a couple months.       Expected Outcomes Short:  continue to take meds as directed and monitor risk factors Long:  keep risk factors managed              Core Components/Risk Factors/Patient Goals at Discharge (Final Review):   Goals and Risk Factor Review - 11/10/20 0809      Core Components/Risk Factors/Patient Goals Review   Personal Goals Review Weight Management/Obesity;Lipids;Diabetes;Hypertension    Review Derek Dickerson is taking all medications as directed.  He does check BP and BG at home.  Today FBG was 126.  He has met with RD and is watching portion sizes.  He will have blood work to recheck cholesterol etc in a couple months.     Expected Outcomes Short:  continue to take meds as directed and monitor risk factors Long:  keep risk factors managed           ITP Comments:  ITP Comments    Row Name 10/13/20 1540 10/18/20 1646 11/01/20 0836 11/03/20 0957 11/08/20 1114   ITP Comments Virtual Visit completed. Patient informed on EP and RD appointment and 6 Minute walk test. Patient also informed of patient health questionnaires on My Chart. Patient Verbalizes understanding. Visit diagnosis can be found in [facility]12/22/2021. Completed 6MWT and gym orientation. Initial ITP created and sent for review to Dr. Emily Filbert, Medical Director. First full day of exercise!  Patient was oriented to gym and equipment including functions, settings, policies, and procedures.  Patient's individual exercise prescription and treatment plan were reviewed.  All starting workloads were established based on the results of the 6 minute walk test done at initial orientation visit.  The plan for exercise progression was also introduced and progression will be customized based on patient's performance and goals. 30 Day review completed. Medical Director ITP review done, changes made as directed, and signed approval by Medical Director.  New to program Completed initial RD evaluation   Row Name 11/23/20 1537 12/01/20 0713         ITP Comments Pt called out sick last week. Currently admitted to [facility] on 2/11 with pancreatitis. On going fever >10 days not responding to antibiotics 30 Day review completed. Medical Director ITP review done, changes made as directed, and signed approval by Medical Director.  REmains out for medical reasons             Comments:

## 2020-12-02 ENCOUNTER — Telehealth: Payer: Self-pay | Admitting: *Deleted

## 2020-12-02 NOTE — Telephone Encounter (Signed)
Called to check in on pt.  He is home and seen in f/u.  Out for week.  He is scheduled to return on Wed 3/2.

## 2020-12-08 ENCOUNTER — Encounter: Payer: Medicare Other | Attending: Cardiology

## 2020-12-08 ENCOUNTER — Other Ambulatory Visit: Payer: Self-pay

## 2020-12-08 DIAGNOSIS — Z955 Presence of coronary angioplasty implant and graft: Secondary | ICD-10-CM | POA: Insufficient documentation

## 2020-12-08 DIAGNOSIS — Z9861 Coronary angioplasty status: Secondary | ICD-10-CM

## 2020-12-08 NOTE — Progress Notes (Signed)
Daily Session Note  Patient Details  Name: Derek Dickerson MRN: [id number] Date of Birth: [birth year] Referring Provider:   Flowsheet Row Cardiac Rehab from 10/18/2020 in [facility] Cardiac and Pulmonary Rehab  Referring Provider [name]      Encounter Date: 12/08/2020  Check In:  Session Check In - 12/08/20 0805      Check-In   Supervising physician immediately available to respond to emergencies See telemetry face sheet for immediately available ER MD    Location [facility]-Cardiac & Pulmonary Rehab    Staff Present Birdie Sons, MPA, RN;Joseph Darrin Nipper, [place], RCEP, CCRP, CCET    Virtual Visit No    Medication changes reported     No    Fall or balance concerns reported    No    Warm-up and Cool-down Performed on first and last piece of equipment    Resistance Training Performed Yes    VAD Patient? No    PAD/SET Patient? No      Pain Assessment   Currently in Pain? No/denies              Social History   Tobacco Use  Smoking Status Former Smoker  . Packs/day: 1.00  . Years: 40.00  . Pack years: 40.00  . Types: Cigarettes  . Quit date: 2013  . Years since quitting: 9.1  Smokeless Tobacco Never Used    Goals Met:  Independence with exercise equipment Exercise tolerated well No report of cardiac concerns or symptoms Strength training completed today  Goals Unmet:  Not Applicable  Comments: Provided letter from MD illustrating approval and safety to continue with rehab. Pt able to follow exercise prescription today without complaint.  Will continue to monitor for progression.    Dr. Emily Filbert is Medical Director for [place] and [facility].

## 2020-12-10 ENCOUNTER — Other Ambulatory Visit: Payer: Self-pay

## 2020-12-10 DIAGNOSIS — Z955 Presence of coronary angioplasty implant and graft: Secondary | ICD-10-CM | POA: Diagnosis not present

## 2020-12-10 DIAGNOSIS — Z9861 Coronary angioplasty status: Secondary | ICD-10-CM

## 2020-12-10 NOTE — Progress Notes (Signed)
Daily Session Note  Patient Details  Name: Derek Dickerson MRN: [id number] Date of Birth: 1960/12/05 Referring Provider:   Flowsheet Row Cardiac Rehab from 10/18/2020 in [facility] Cardiac and Pulmonary Rehab  Referring Provider [name]      Encounter Date: 12/10/2020  Check In:  Session Check In - 12/10/20 0816      Check-In   Supervising physician immediately available to respond to emergencies See telemetry face sheet for immediately available ER MD    Location [facility]-Cardiac & Pulmonary Rehab    Staff Present Birdie Sons, MPA, RN;Joseph Darrin Nipper, [place], RCEP, CCRP, CCET    Virtual Visit No    Medication changes reported     No    Fall or balance concerns reported    No    Warm-up and Cool-down Performed on first and last piece of equipment    Resistance Training Performed Yes    VAD Patient? No    PAD/SET Patient? No      Pain Assessment   Currently in Pain? No/denies              Social History   Tobacco Use  Smoking Status Former Smoker  . Packs/day: 1.00  . Years: 40.00  . Pack years: 40.00  . Types: Cigarettes  . Quit date: 2013  . Years since quitting: 9.1  Smokeless Tobacco Never Used    Goals Met:  Independence with exercise equipment Exercise tolerated well No report of cardiac concerns or symptoms Strength training completed today  Goals Unmet:  Not Applicable  Comments: Pt able to follow exercise prescription today without complaint.  Will continue to monitor for progression.    Dr. Emily Filbert is Medical Director for [place] and [facility].

## 2020-12-13 ENCOUNTER — Encounter: Payer: Medicare Other | Admitting: *Deleted

## 2020-12-13 ENCOUNTER — Other Ambulatory Visit: Payer: Self-pay

## 2020-12-13 DIAGNOSIS — Z955 Presence of coronary angioplasty implant and graft: Secondary | ICD-10-CM | POA: Diagnosis not present

## 2020-12-13 DIAGNOSIS — Z9861 Coronary angioplasty status: Secondary | ICD-10-CM

## 2020-12-13 NOTE — Progress Notes (Signed)
Daily Session Note  Patient Details  Name: Derek Dickerson MRN: [id number] Date of Birth: December 17, 1960 Referring Provider:   Flowsheet Row Cardiac Rehab from 10/18/2020 in [facility] Cardiac and Pulmonary Rehab  Referring Provider [name]      Encounter Date: 12/13/2020  Check In:  Session Check In - 12/13/20 0801      Check-In   Supervising physician immediately available to respond to emergencies See telemetry face sheet for immediately available ER MD    Location [facility]-Cardiac & Pulmonary Rehab    Staff Present Heath Lark, RN, BSN, Laveda Norman, BS, ACSM CEP, Exercise Physiologist;Joseph Tessie Fass RCP,RRT,BSRT    Virtual Visit No    Medication changes reported     No    Fall or balance concerns reported    No    Warm-up and Cool-down Performed on first and last piece of equipment    Resistance Training Performed Yes    VAD Patient? No    PAD/SET Patient? No      Pain Assessment   Currently in Pain? No/denies              Social History   Tobacco Use  Smoking Status Former Smoker  . Packs/day: 1.00  . Years: 40.00  . Pack years: 40.00  . Types: Cigarettes  . Quit date: 2013  . Years since quitting: 9.1  Smokeless Tobacco Never Used    Goals Met:  Independence with exercise equipment Exercise tolerated well No report of cardiac concerns or symptoms  Goals Unmet:  Not Applicable  Comments: Pt able to follow exercise prescription today without complaint.  Will continue to monitor for progression.    Dr. Emily Filbert is Medical Director for [place] and [facility].

## 2020-12-15 ENCOUNTER — Other Ambulatory Visit: Payer: Self-pay

## 2020-12-15 DIAGNOSIS — Z955 Presence of coronary angioplasty implant and graft: Secondary | ICD-10-CM | POA: Diagnosis not present

## 2020-12-15 DIAGNOSIS — Z9861 Coronary angioplasty status: Secondary | ICD-10-CM

## 2020-12-15 NOTE — Progress Notes (Signed)
Daily Session Note  Patient Details  Name: Derek Dickerson MRN: [id number] Date of Birth: Apr 24, 1961 Referring Provider:   Flowsheet Row Cardiac Rehab from 10/18/2020 in [facility] Cardiac and Pulmonary Rehab  Referring Provider [name]      Encounter Date: 12/15/2020  Check In:  Session Check In - 12/15/20 0740      Check-In   Supervising physician immediately available to respond to emergencies See telemetry face sheet for immediately available ER MD    Location [facility]-Cardiac & Pulmonary Rehab    Staff Present Birdie Sons, MPA, Elveria Rising, BA, ACSM CEP, Exercise Physiologist;Jessica Luan Pulling, MA, RCEP, CCRP, CCET    Virtual Visit No    Medication changes reported     No    Fall or balance concerns reported    No    Warm-up and Cool-down Performed on first and last piece of equipment    Resistance Training Performed Yes    VAD Patient? No    PAD/SET Patient? No      Pain Assessment   Currently in Pain? No/denies              Social History   Tobacco Use  Smoking Status Former Smoker  . Packs/day: 1.00  . Years: 40.00  . Pack years: 40.00  . Types: Cigarettes  . Quit date: 2013  . Years since quitting: 9.1  Smokeless Tobacco Never Used    Goals Met:  Independence with exercise equipment Exercise tolerated well No report of cardiac concerns or symptoms Strength training completed today  Goals Unmet:  Not Applicable  Comments: Pt able to follow exercise prescription today without complaint.  Will continue to monitor for progression.    Dr. Emily Filbert is Medical Director for [place] and [facility].

## 2020-12-20 ENCOUNTER — Other Ambulatory Visit: Payer: Self-pay

## 2020-12-20 ENCOUNTER — Encounter: Payer: Medicare Other | Admitting: *Deleted

## 2020-12-20 DIAGNOSIS — Z955 Presence of coronary angioplasty implant and graft: Secondary | ICD-10-CM | POA: Diagnosis not present

## 2020-12-20 DIAGNOSIS — Z9861 Coronary angioplasty status: Secondary | ICD-10-CM

## 2020-12-20 NOTE — Progress Notes (Signed)
Daily Session Note  Patient Details  Name: Derek Dickerson MRN: [id number] Date of Birth: [birth year] Referring Provider:   Flowsheet Row Cardiac Rehab from 10/18/2020 in [facility] Cardiac and Pulmonary Rehab  Referring Provider [name]      Encounter Date: 12/20/2020  Check In:      Social History   Tobacco Use  Smoking Status Former Smoker  . Packs/day: 1.00  . Years: 40.00  . Pack years: 40.00  . Types: Cigarettes  . Quit date: 2013  . Years since quitting: 9.2  Smokeless Tobacco Never Used    Goals Met:  Independence with exercise equipment Exercise tolerated well No report of cardiac concerns or symptoms  Goals Unmet:  Not Applicable  Comments: Pt able to follow exercise prescription today without complaint.  Will continue to monitor for progression.    Dr. Emily Filbert is Medical Director for [place] and [facility].

## 2020-12-23 ENCOUNTER — Telehealth: Payer: Self-pay

## 2020-12-23 NOTE — Telephone Encounter (Signed)
Derek Dickerson called to let us know the reason for his frequent fevers is chronic pancreatitis. He reports that his doctor did not give him any activity restrictions and he will be back in tomorrow for rehab.

## 2020-12-29 ENCOUNTER — Encounter: Payer: Self-pay | Admitting: *Deleted

## 2020-12-29 DIAGNOSIS — Z9861 Coronary angioplasty status: Secondary | ICD-10-CM

## 2020-12-29 NOTE — Progress Notes (Signed)
Cardiac Individual Treatment Plan  Patient Details  Name: Derek Dickerson MRN: [id number] Date of Birth: 1961/05/05 Referring Provider:   Flowsheet Row Cardiac Rehab from 10/18/2020 in [facility] Cardiac and Pulmonary Rehab  Referring Provider [name]      Initial Encounter Date:  Flowsheet Row Cardiac Rehab from 10/18/2020 in [facility] Cardiac and Pulmonary Rehab  Date 10/18/20      Visit Diagnosis: S/P PTCA (percutaneous transluminal coronary angioplasty)  Patient's Home Medications on Admission:  Current Outpatient Medications:  .  aspirin 81 MG EC tablet, Take by mouth., Disp: , Rfl:  .  Blood Glucose Monitoring Suppl (FIFTY50 GLUCOSE METER 2.0) w/Device KIT, Tests 8 times a day, One touch Ultra 2 glucometer, E08.9, Disp: , Rfl:  .  buPROPion (WELLBUTRIN XL) 300 MG 24 hr tablet, Take by mouth., Disp: , Rfl:  .  cetirizine-pseudoephedrine (ZYRTEC-D) 5-120 MG tablet, Take by mouth., Disp: , Rfl:  .  Cholecalciferol 25 MCG (1000 UT) tablet, Take by mouth., Disp: , Rfl:  .  clopidogrel (PLAVIX) 75 MG tablet, Take by mouth., Disp: , Rfl:  .  EPINEPHrine 0.3 mg/0.3 mL IJ SOAJ injection, Inject into the muscle., Disp: , Rfl:  .  ferrous sulfate 325 (65 FE) MG tablet, Take by mouth., Disp: , Rfl:  .  gabapentin (NEURONTIN) 100 MG capsule, Take by mouth., Disp: , Rfl:  .  glucose blood (PRECISION QID TEST) test strip, Use one. 5 (five) to 8 (eight) times daily as needed. Use as instructed. ONE TOUCH ULTRA, Disp: , Rfl:  .  insulin glargine (LANTUS) 100 UNIT/ML Solostar Pen, Inject into the skin., Disp: , Rfl:  .  insulin lispro (HUMALOG) 100 UNIT/ML KwikPen, INJECT 20-30 UNITS WITH MEALS OR SNACKS UP TO FIVE TIMES DAILY (MAX OF 140 UNITS PER DAY), Disp: , Rfl:  .  Insulin Pen Needle (B-D UF III MINI PEN NEEDLES) 31G X 5 MM MISC, USE WITH INSULIN 7 TIMES DAILY, Disp: , Rfl:  .  omeprazole (PRILOSEC) 40 MG capsule, Take by mouth., Disp: , Rfl:  .  oxyCODONE (OXY IR/ROXICODONE) 5 MG immediate release  tablet, Take by mouth., Disp: , Rfl:  .  Pancrelipase, Lip-Prot-Amyl, (CREON) 24000-76000 units CPEP, Take by mouth., Disp: , Rfl:  .  rosuvastatin (CRESTOR) 20 MG tablet, Take by mouth., Disp: , Rfl:   Past Medical History: Past Medical History:  Diagnosis Date  . Diabetes mellitus without complication ([place])     Tobacco Use: Social History   Tobacco Use  Smoking Status Former Smoker  . Packs/day: 1.00  . Years: 40.00  . Pack years: 40.00  . Types: Cigarettes  . Quit date: 2013  . Years since quitting: 9.2  Smokeless Tobacco Never Used    Labs: Recent Review Flowsheet Data   There is no flowsheet data to display.      Exercise Target Goals: Exercise Program Goal: Individual exercise prescription set using results from initial 6 min walk test and THRR while considering  patient's activity barriers and safety.   Exercise Prescription Goal: Initial exercise prescription builds to 30-45 minutes a day of aerobic activity, 2-3 days per week.  Home exercise guidelines will be given to patient during program as part of exercise prescription that the participant will acknowledge.   Education: Aerobic Exercise: - Group verbal and visual presentation on the components of exercise prescription. Introduces F.I.T.T principle from ACSM for exercise prescriptions.  Reviews F.I.T.T. principles of aerobic exercise including progression. Written material given at graduation.   Education: Resistance Exercise: -  Group verbal and visual presentation on the components of exercise prescription. Introduces F.I.T.T principle from ACSM for exercise prescriptions  Reviews F.I.T.T. principles of resistance exercise including progression. Written material given at graduation.    Education: Exercise & Equipment Safety: - Individual verbal instruction and demonstration of equipment use and safety with use of the equipment. Flowsheet Row Cardiac Rehab from 11/10/2020 in [facility] Cardiac and Pulmonary Rehab   Date 10/18/20  Educator AS  Instruction Review Code 1- Verbalizes Understanding      Education: Exercise Physiology & General Exercise Guidelines: - Group verbal and written instruction with models to review the exercise physiology of the cardiovascular system and associated critical values. Provides general exercise guidelines with specific guidelines to those with heart or lung disease.    Education: Flexibility, Balance, Mind/Body Relaxation: - Group verbal and visual presentation with interactive activity on the components of exercise prescription. Introduces F.I.T.T principle from ACSM for exercise prescriptions. Reviews F.I.T.T. principles of flexibility and balance exercise training including progression. Also discusses the mind body connection.  Reviews various relaxation techniques to help reduce and manage stress (i.e. Deep breathing, progressive muscle relaxation, and visualization). Balance handout provided to take home. Written material given at graduation.   Activity Barriers & Risk Stratification:   6 Minute Walk:  6 Minute Walk    Row Name 10/18/20 1634         6 Minute Walk   Phase Initial     Distance 1400 feet     Walk Time 6 minutes     # of Rest Breaks 0     MPH 2.65     METS 3.3     RPE 12     Perceived Dyspnea  1     VO2 Peak 11.5     Symptoms No     Resting HR 75 bpm     Resting BP 136/72     Resting Oxygen Saturation  99 %     Exercise Oxygen Saturation  during 6 min walk 98 %     Max Ex. HR 103 bpm     Max Ex. BP 160/76     2 Minute Post BP 134/60            Oxygen Initial Assessment:   Oxygen Re-Evaluation:   Oxygen Discharge (Final Oxygen Re-Evaluation):   Initial Exercise Prescription:  Initial Exercise Prescription - 10/18/20 1600      Date of Initial Exercise RX and Referring Provider   Date 10/18/20    Referring Provider [name]      Treadmill   MPH 2.5    Grade 0.5    Minutes 15    METs 3.3      REL-XR   Level 3     Speed 50    Minutes 15    METs 3.3      T5 Nustep   Level 2    SPM 80    Minutes 15    METs 3.3      Prescription Details   Frequency (times per week) 3    Duration Progress to 30 minutes of continuous aerobic without signs/symptoms of physical distress      Intensity   THRR 40-80% of Max Heartrate 109-142    Ratings of Perceived Exertion 11-15    Perceived Dyspnea 0-4      Resistance Training   Training Prescription Yes    Weight 4 lb    Reps 10-15  Perform Capillary Blood Glucose checks as needed.  Exercise Prescription Changes:  Exercise Prescription Changes    Row Name 10/18/20 1600 11/08/20 1600 11/10/20 0800 11/23/20 1500 12/08/20 1600     Response to Exercise   Blood Pressure (Admit) 136/72 130/82 -- 136/56 130/60   Blood Pressure (Exercise) 160/76 140/78 -- 144/70 158/58   Blood Pressure (Exit) 134/60 142/86 -- 122/60 132/74   Heart Rate (Admit) 75 bpm 76 bpm -- 75 bpm 86 bpm   Heart Rate (Exercise) 103 bpm 96 bpm -- 97 bpm 111 bpm   Heart Rate (Exit) 89 bpm 72 bpm -- 73 bpm 75 bpm   Oxygen Saturation (Admit) 99 % -- -- -- --   Oxygen Saturation (Exercise) 98 % -- -- -- --   Rating of Perceived Exertion (Exercise) 12 13 -- 14 13   Perceived Dyspnea (Exercise) 1 -- -- -- --   Symptoms none none -- none none   Comments -- -- -- from visit on 11/12/20 --   Duration -- Continue with 30 min of aerobic exercise without signs/symptoms of physical distress. -- Continue with 30 min of aerobic exercise without signs/symptoms of physical distress. Continue with 30 min of aerobic exercise without signs/symptoms of physical distress.   Intensity -- THRR unchanged -- THRR unchanged THRR unchanged     Progression   Progression -- Continue to progress workloads to maintain intensity without signs/symptoms of physical distress. -- Continue to progress workloads to maintain intensity without signs/symptoms of physical distress. Continue to progress workloads to  maintain intensity without signs/symptoms of physical distress.   Average METs -- 2.53 -- 2.63 3.5     Resistance Training   Training Prescription -- Yes -- Yes Yes   Weight -- 5 lb -- 5 lb 5 lb   Reps -- 10-15 -- 10-15 10-15     Interval Training   Interval Training -- No -- No --     Treadmill   MPH -- 2.5 -- 2.6 3   Grade -- 0.5 -- 0.5 0.5   Minutes -- 15 -- 15 15   METs -- 3.09 -- 3.17 3.5     REL-XR   Level -- 3 -- -- --   Minutes -- 15 -- -- --   METs -- 2.3 -- -- --     T5 Nustep   Level -- 2 -- 3 --   Minutes -- 15 -- 15 --   METs -- 2.2 -- 2.1 --     Home Exercise Plan   Plans to continue exercise at -- -- [place]   Frequency -- -- Add 2 additional days to program exercise sessions. Add 2 additional days to program exercise sessions. Add 2 additional days to program exercise sessions.   Initial Home Exercises Provided -- -- 11/10/20 11/10/20 11/10/20   Row Name 12/22/20 1000             Response to Exercise   Blood Pressure (Admit) 122/70       Blood Pressure (Exercise) 148/82       Blood Pressure (Exit) 128/70       Heart Rate (Admit) 90 bpm       Heart Rate (Exercise) 111 bpm       Heart Rate (Exit) 88 bpm       Rating of Perceived Exertion (Exercise) 15       Symptoms none       Duration Continue with 30  min of aerobic exercise without signs/symptoms of physical distress.       Intensity THRR unchanged               Progression   Progression Continue to progress workloads to maintain intensity without signs/symptoms of physical distress.       Average METs 3.27               Resistance Training   Training Prescription Yes       Weight 5 lb       Reps 10-15               Interval Training   Interval Training No               Treadmill   MPH 3       Grade 0.5       Minutes 15       METs 3.5               REL-XR   Level 4       Minutes 15       METs 4               T5 Nustep    Level 2       Minutes 15       METs 2.3               Home Exercise Plan   Plans to continue exercise at [facility]       Frequency Add 2 additional days to program exercise sessions.       Initial Home Exercises Provided 11/10/20              Exercise Comments:  Exercise Comments    Row Name 11/01/20 1062 12/08/20 0809         Exercise Comments First full day of exercise!  Patient was oriented to gym and equipment including functions, settings, policies, and procedures.  Patient's individual exercise prescription and treatment plan were reviewed.  All starting workloads were established based on the results of the 6 minute walk test done at initial orientation visit.  The plan for exercise progression was also introduced and progression will be customized based on patient's performance and goals. Patient returned today and provided letter from MD illustrating approval and safety to continue with rehab.             Exercise Goals and Review:  Exercise Goals    Row Name 10/18/20 1637             Exercise Goals   Increase Physical Activity Yes       Intervention Provide advice, education, support and counseling about physical activity/exercise needs.;Develop an individualized exercise prescription for aerobic and resistive training based on initial evaluation findings, risk stratification, comorbidities and participant's personal goals.       Expected Outcomes Short Term: Attend rehab on a regular basis to increase amount of physical activity.;Long Term: Add in home exercise to make exercise part of routine and to increase amount of physical activity.;Long Term: Exercising regularly at least 3-5 days a week.       Increase Strength and Stamina Yes       Intervention Provide advice, education, support and counseling about physical activity/exercise needs.       Expected Outcomes Short Term: Increase workloads from initial exercise prescription for resistance, speed, and  METs.;Short Term: Perform resistance training exercises routinely during rehab and add  in resistance training at home;Long Term: Improve cardiorespiratory fitness, muscular endurance and strength as measured by increased METs and functional capacity ( )       Able to understand and use rate of perceived exertion (RPE) scale Yes       Intervention Provide education and explanation on how to use RPE scale       Expected Outcomes Short Term: Able to use RPE daily in rehab to express subjective intensity level;Long Term:  Able to use RPE to guide intensity level when exercising independently       Able to understand and use Dyspnea scale Yes       Intervention Provide education and explanation on how to use Dyspnea scale       Expected Outcomes Short Term: Able to use Dyspnea scale daily in rehab to express subjective sense of shortness of breath during exertion;Long Term: Able to use Dyspnea scale to guide intensity level when exercising independently       Knowledge and understanding of Target Heart Rate Range (THRR) Yes       Intervention Provide education and explanation of THRR including how the numbers were predicted and where they are located for reference       Expected Outcomes Short Term: Able to state/look up THRR;Short Term: Able to use daily as guideline for intensity in rehab;Long Term: Able to use THRR to govern intensity when exercising independently       Able to check pulse independently Yes       Intervention Provide education and demonstration on how to check pulse in carotid and radial arteries.;Review the importance of being able to check your own pulse for safety during independent exercise       Expected Outcomes Short Term: Able to explain why pulse checking is important during independent exercise;Long Term: Able to check pulse independently and accurately       Understanding of Exercise Prescription Yes       Intervention Provide education, explanation, and written materials  on patient's individual exercise prescription       Expected Outcomes Short Term: Able to explain program exercise prescription;Long Term: Able to explain home exercise prescription to exercise independently              Exercise Goals Re-Evaluation :  Exercise Goals Re-Evaluation    Row Name 11/01/20 0836 11/08/20 1629 11/10/20 0829 11/23/20 1543 12/08/20 0813     Exercise Goal Re-Evaluation   Exercise Goals Review Able to understand and use rate of perceived exertion (RPE) scale;Able to understand and use Dyspnea scale;Knowledge and understanding of Target Heart Rate Range (THRR);Understanding of Exercise Prescription Increase Physical Activity;Increase Strength and Stamina;Understanding of Exercise Prescription Increase Physical Activity;Increase Strength and Stamina;Able to understand and use rate of perceived exertion (RPE) scale;Knowledge and understanding of Target Heart Rate Range (THRR);Able to check pulse independently;Understanding of Exercise Prescription Increase Physical Activity;Increase Strength and Stamina;Understanding of Exercise Prescription Increase Physical Activity;Increase Strength and Stamina;Understanding of Exercise Prescription   Comments Reviewed RPE and dyspnea scales, THR and program prescription with pt today.  Pt voiced understanding and was given a copy of goals to take home. Derek Dickerson is off to a good start in rehab.  He has completed his first three full days of exercise already.  We will continue to monitor his progress. Reviewed home exercise with pt today.  Pt plans to walk and use stationary bike for exercise.  Reviewed THR, pulse, RPE, sign and symptoms, pulse oximetery and when to call 911  or MD.  Also discussed weather considerations and indoor options.  Pt voiced understanding. Only attended once since last review.  Currently in hospital He has a recumbant bike he uses (hasn't in the last week or so). He was doing it 3-4x/week and 30 minutes (warm up and cool down  included) RPE 13-15.   Expected Outcomes Short: Use RPE daily to regulate intensity. Long: Follow program prescription in THR. Short: Continue to attend rehab regularly  Long: Continue to follow program prescription. Short: add 2 days of exercise outside program sessions Long:  improve overall MET level Short: Clearance to return once discharged from hospital Long: Return to regular attendance ST: return to exercising at home using his recumbant bike LT: continue regular attendance at rehab, exercise at home when not at rehab regularly   [place] Name 12/22/20 1031             Exercise Goal Re-Evaluation   Exercise Goals Review Increase Physical Activity;Increase Strength and Stamina;Understanding of Exercise Prescription       Comments Derek Dickerson has been doing well in rehab.  He is up to level 4 on the XR.  He called out today with another fever.  We will continue to monitor his progress.       Expected Outcomes Short: Figure out fever Long: Continue to improve stmaina.              Discharge Exercise Prescription (Final Exercise Prescription Changes):  Exercise Prescription Changes - 12/22/20 1000      Response to Exercise   Blood Pressure (Admit) 122/70    Blood Pressure (Exercise) 148/82    Blood Pressure (Exit) 128/70    Heart Rate (Admit) 90 bpm    Heart Rate (Exercise) 111 bpm    Heart Rate (Exit) 88 bpm    Rating of Perceived Exertion (Exercise) 15    Symptoms none    Duration Continue with 30 min of aerobic exercise without signs/symptoms of physical distress.    Intensity THRR unchanged      Progression   Progression Continue to progress workloads to maintain intensity without signs/symptoms of physical distress.    Average METs 3.27      Resistance Training   Training Prescription Yes    Weight 5 lb    Reps 10-15      Interval Training   Interval Training No      Treadmill   MPH 3    Grade 0.5    Minutes 15    METs 3.5      REL-XR   Level 4    Minutes 15    METs  4      T5 Nustep   Level 2    Minutes 15    METs 2.3      Home Exercise Plan   Plans to continue exercise at [facility]    Frequency Add 2 additional days to program exercise sessions.    Initial Home Exercises Provided 11/10/20           Nutrition:  Target Goals: Understanding of nutrition guidelines, daily intake of sodium '1500mg'$ , cholesterol '200mg'$ , calories 30% from fat and 7% or less from saturated fats, daily to have 5 or more servings of fruits and vegetables.  Education: All About Nutrition: -Group instruction provided by verbal, written material, interactive activities, discussions, models, and posters to present general guidelines for heart healthy nutrition including fat, fiber, MyPlate, the role of sodium in heart healthy nutrition, utilization of the nutrition  label, and utilization of this knowledge for meal planning. Follow up email sent as well. Written material given at graduation.   Biometrics:  Pre Biometrics - 10/18/20 1637      Pre Biometrics   Height 5' 10.75" (1.797 m)    Weight 260 lb 8 oz (118.2 kg)    BMI (Calculated) 36.59            Nutrition Therapy Plan and Nutrition Goals:  Nutrition Therapy & Goals - 11/08/20 1047      Nutrition Therapy   Diet Heart healthy, low Na, diabetes friendly    Drug/Food Interactions Statins/Certain Fruits    Protein (specify units) 95g    Fiber 30 grams    Whole Grain Foods 3 servings    Saturated Fats 12 max. grams    Fruits and Vegetables 8 servings/day    Sodium 1.5 grams      Personal Nutrition Goals   Nutrition Goal ST: prepare lunches, prepare dinner 3x/week LT: cook/prepare most meals at home    Comments A1C 8 (January 2022). Insulin - short acting and long lasting. When his BG gets too low he uses orange juice. He feels he eats too much fatty foods. B: 3 tbsp - vanilla yogurt with 1/2 cup granola L: frozen meals D: fast food, frozen foods S: potato chips Drinks: diet soda and sweet tea  (splenda). Discussed heart healthy eating and diabetes friendly eating. He feels he does not have a good diet and would like to start preparing his own meals as he feels that was the barrier for him to eat healthier.      Intervention Plan   Intervention Prescribe, educate and counsel regarding individualized specific dietary modifications aiming towards targeted core components such as weight, hypertension, lipid management, diabetes, heart failure and other comorbidities.;Nutrition handout(s) given to patient.    Expected Outcomes Short Term Goal: Understand basic principles of dietary content, such as calories, fat, sodium, cholesterol and nutrients.;Long Term Goal: Adherence to prescribed nutrition plan.;Short Term Goal: A plan has been developed with personal nutrition goals set during dietitian appointment.           Nutrition Assessments:  MEDIFICTS Score Key:  ?70 Need to make dietary changes   40-70 Heart Healthy Diet  ? 40 Therapeutic Level Cholesterol Diet  Flowsheet Row Cardiac Rehab from 10/18/2020 in [facility] Cardiac and Pulmonary Rehab  Picture Your Plate Total Score on Admission 59     Picture Your Plate Scores:  <17 Unhealthy dietary pattern with much room for improvement.  41-50 Dietary pattern unlikely to meet recommendations for good health and room for improvement.  51-60 More healthful dietary pattern, with some room for improvement.   >60 Healthy dietary pattern, although there may be some specific behaviors that could be improved.    Nutrition Goals Re-Evaluation:  Nutrition Goals Re-Evaluation    [place] Name 12/08/20 0818             Goals   Nutrition Goal ST: prepare lunches, prepare dinner 3x/week LT: cook/prepare most meals at home       Comment He Dickerson eating more fruits and vegetables (apples, oranges, salads) and is eating less red meat (1-2x/week now). He is preparring two meals at home now, he Dickerson no barriers at this time and would like to  continue working towards his goal.       Expected Outcome ST: prepare lunches, prepare dinner 3x/week LT: cook/prepare most meals at home  Nutrition Goals Discharge (Final Nutrition Goals Re-Evaluation):  Nutrition Goals Re-Evaluation - 12/08/20 0818      Goals   Nutrition Goal ST: prepare lunches, prepare dinner 3x/week LT: cook/prepare most meals at home    Comment He Dickerson eating more fruits and vegetables (apples, oranges, salads) and is eating less red meat (1-2x/week now). He is preparring two meals at home now, he Dickerson no barriers at this time and would like to continue working towards his goal.    Expected Outcome ST: prepare lunches, prepare dinner 3x/week LT: cook/prepare most meals at home           Psychosocial: Target Goals: Acknowledge presence or absence of significant depression and/or stress, maximize coping skills, provide positive support system. Participant is able to verbalize types and ability to use techniques and skills needed for reducing stress and depression.   Education: Stress, Anxiety, and Depression - Group verbal and visual presentation to define topics covered.  Reviews how body is impacted by stress, anxiety, and depression.  Also discusses healthy ways to reduce stress and to treat/manage anxiety and depression.  Written material given at graduation. Flowsheet Row Cardiac Rehab from 11/10/2020 in [facility] Cardiac and Pulmonary Rehab  Date 11/10/20  Educator [facility]  Instruction Review Code 1- Verbalizes Understanding      Education: Sleep Hygiene -Provides group verbal and written instruction about how sleep can affect your health.  Define sleep hygiene, discuss sleep cycles and impact of sleep habits. Review good sleep hygiene tips.    Initial Review & Psychosocial Screening:  Initial Psych Review & Screening - 10/13/20 1542      Initial Review   Current issues with History of Depression;Current Depression      Family Dynamics   Good  Support System? Yes    Comments He can look to his wife and his three dogs.He has a posiive outlook on his health. Derek Dickerson takes medication for his mood and states that it helps.      Barriers   Psychosocial barriers to participate in program The patient should benefit from training in stress management and relaxation.      Screening Interventions   Interventions Program counselor consult;To provide support and resources with identified psychosocial needs;Encouraged to exercise;Provide feedback about the scores to participant           Quality of Life Scores:   Quality of Life - 10/18/20 1642      Quality of Life   Select Quality of Life      Quality of Life Scores   Health/Function Pre 20.57 %    Socioeconomic Pre 26.57 %    Psych/Spiritual Pre 30 %    Family Pre 28.5 %    GLOBAL Pre 24.94 %          Scores of 19 and below usually indicate a poorer quality of life in these areas.  A difference of  2-3 points is a clinically meaningful difference.  A difference of 2-3 points in the total score of the Quality of Life Index has been associated with significant improvement in overall quality of life, self-image, physical symptoms, and general health in studies assessing change in quality of life.  PHQ-9: Recent Review Flowsheet Data    Depression screen [facility] 2/9 10/18/2020   Decreased Interest 0   Down, Depressed, Hopeless 0   PHQ - 2 Score 0   Altered sleeping 0   Tired, decreased energy 0   Change in appetite 0   Feeling bad  or failure about yourself  0   Trouble concentrating 0   Moving slowly or fidgety/restless 0   Suicidal thoughts 0   PHQ-9 Score 0     Interpretation of Total Score  Total Score Depression Severity:  1-4 = Minimal depression, 5-9 = Mild depression, 10-14 = Moderate depression, 15-19 = Moderately severe depression, 20-27 = Severe depression   Psychosocial Evaluation and Intervention:  Psychosocial Evaluation - 10/13/20 1545      Psychosocial  Evaluation & Interventions   Interventions Encouraged to exercise with the program and follow exercise prescription;Relaxation education;Stress management education    Comments He can look to his wife and his three dogs.He has a posiive outlook on his health. Derek Dickerson takes medication for his mood and states that it helps.    Expected Outcomes Short: Exercise regularly to support mental health and notify staff of any changes. Long: maintain mental health and well being through teaching of rehab or prescribed medications independently.    Continue Psychosocial Services  Follow up required by staff           Psychosocial Re-Evaluation:  Psychosocial Re-Evaluation    Row Name 11/10/20 [phone] 12/08/20 0817           Psychosocial Re-Evaluation   Comments Derek Dickerson no stress, depression or anxiety symptoms.  He sleeps well most of the time. Derek Dickerson no stress, depression or anxiety symptoms.  He sleeps well most of the time: getting about 8 hours of sleep - he is up about every 2 hours to go to the bathroom.      Expected Outcomes Short: continue to attend [facility] to help manage  stress/sleep Long: maintain positive outlook Short: continue to attend [facility] Track to help manage  stress/sleep Long: maintain positive outlook      Interventions -- Encouraged to attend Cardiac Rehabilitation for the exercise      Continue Psychosocial Services  -- Follow up required by staff             Psychosocial Discharge (Final Psychosocial Re-Evaluation):  Psychosocial Re-Evaluation - 12/08/20 0817      Psychosocial Re-Evaluation   Comments Derek Dickerson no stress, depression or anxiety symptoms.  He sleeps well most of the time: getting about 8 hours of sleep - he is up about every 2 hours to go to the bathroom.    Expected Outcomes Short: continue to attend Heart Track to help manage  stress/sleep Long: maintain positive outlook    Interventions Encouraged to attend Cardiac Rehabilitation for the  exercise    Continue Psychosocial Services  Follow up required by staff           Vocational Rehabilitation: Provide vocational rehab assistance to qualifying candidates.   Vocational Rehab Evaluation & Intervention:   Education: Education Goals: Education classes will be provided on a variety of topics geared toward better understanding of heart health and risk factor modification. Participant will state understanding/return demonstration of topics presented as noted by education test scores.  Learning Barriers/Preferences:  Learning Barriers/Preferences - 10/13/20 1541      Learning Barriers/Preferences   Learning Barriers None    Learning Preferences None           General Cardiac Education Topics:  AED/CPR: - Group verbal and written instruction with the use of models to demonstrate the basic use of the AED with the basic ABC's of resuscitation.   Anatomy and Cardiac Procedures: - Group verbal and visual presentation and models provide information about basic  cardiac anatomy and function. Reviews the testing methods done to diagnose heart disease and the outcomes of the test results. Describes the treatment choices: Medical Management, Angioplasty, or Coronary Bypass Surgery for treating various heart conditions including Myocardial Infarction, Angina, Valve Disease, and Cardiac Arrhythmias.  Written material given at graduation.   Medication Safety: - Group verbal and visual instruction to review commonly prescribed medications for heart and lung disease. Reviews the medication, class of the drug, and side effects. Includes the steps to properly store meds and maintain the prescription regimen.  Written material given at graduation.   Intimacy: - Group verbal instruction through game format to discuss how heart and lung disease can affect sexual intimacy. Written material given at graduation..   Know Your Numbers and Heart Failure: - Group verbal and visual  instruction to discuss disease risk factors for cardiac and pulmonary disease and treatment options.  Reviews associated critical values for Overweight/Obesity, Hypertension, Cholesterol, and Diabetes.  Discusses basics of heart failure: signs/symptoms and treatments.  Introduces Heart Failure Zone chart for action plan for heart failure.  Written material given at graduation.   Infection Prevention: - Provides verbal and written material to individual with discussion of infection control including proper hand washing and proper equipment cleaning during exercise session. Flowsheet Row Cardiac Rehab from 11/10/2020 in [facility] Cardiac and Pulmonary Rehab  Date 10/18/20  Educator AS  Instruction Review Code 1- Verbalizes Understanding      Falls Prevention: - Provides verbal and written material to individual with discussion of falls prevention and safety. Flowsheet Row Cardiac Rehab from 11/10/2020 in [facility] Cardiac and Pulmonary Rehab  Date 10/18/20  Educator AS  Instruction Review Code 1- Verbalizes Understanding      Other: -Provides group and verbal instruction on various topics (see comments)   Knowledge Questionnaire Score:  Knowledge Questionnaire Score - 10/18/20 1639      Knowledge Questionnaire Score   Pre Score 21/26 exercise nutrition           Core Components/Risk Factors/Patient Goals at Admission:  Personal Goals and Risk Factors at Admission - 10/18/20 1643      Core Components/Risk Factors/Patient Goals on Admission    Weight Management Weight Loss;Yes    Intervention Weight Management: Develop a combined nutrition and exercise program designed to reach desired caloric intake, while maintaining appropriate intake of nutrient and fiber, sodium and fats, and appropriate energy expenditure required for the weight goal.;Weight Management: Provide education and appropriate resources to help participant work on and attain dietary goals.;Weight Management/Obesity: Establish  reasonable short term and long term weight goals.    Admit Weight 260 lb 8 oz (118.2 kg)    Goal Weight: Short Term 255 lb (115.7 kg)    Goal Weight: Long Term 225 lb (102.1 kg)    Expected Outcomes Long Term: Adherence to nutrition and physical activity/exercise program aimed toward attainment of established weight goal;Short Term: Continue to assess and modify interventions until short term weight is achieved;Weight Loss: Understanding of general recommendations for a balanced deficit meal plan, which promotes 1-2 lb weight loss per week and includes a negative energy balance of [id number] kcal/d;Understanding recommendations for meals to include 15-35% energy as protein, 25-35% energy from fat, 35-60% energy from carbohydrates, less than $RemoveB'200mg'aRFpihMU$  of dietary cholesterol, 20-35 gm of total fiber daily;Understanding of distribution of calorie intake throughout the day with the consumption of 4-5 meals/snacks    Diabetes Yes    Intervention Provide education about signs/symptoms and action to take for  hypo/hyperglycemia.;Provide education about proper nutrition, including hydration, and aerobic/resistive exercise prescription along with prescribed medications to achieve blood glucose in normal ranges: Fasting glucose 65-99 mg/dL    Expected Outcomes Short Term: Participant verbalizes understanding of the signs/symptoms and immediate care of hyper/hypoglycemia, proper foot care and importance of medication, aerobic/resistive exercise and nutrition plan for blood glucose control.;Long Term: Attainment of HbA1C < 7%.    Hypertension Yes    Intervention Provide education on lifestyle modifcations including regular physical activity/exercise, weight management, moderate sodium restriction and increased consumption of fresh fruit, vegetables, and low fat dairy, alcohol moderation, and smoking cessation.;Monitor prescription use compliance.    Expected Outcomes Short Term: Continued assessment and intervention until BP  is < 140/68mm HG in hypertensive participants. < 130/37mm HG in hypertensive participants with diabetes, heart failure or chronic kidney disease.;Long Term: Maintenance of blood pressure at goal levels.    Lipids Yes    Intervention Provide education and support for participant on nutrition & aerobic/resistive exercise along with prescribed medications to achieve LDL '70mg'$ , HDL >$Remo'40mg'bthfT$ .    Expected Outcomes Short Term: Participant states understanding of desired cholesterol values and is compliant with medications prescribed. Participant is following exercise prescription and nutrition guidelines.;Long Term: Cholesterol controlled with medications as prescribed, with individualized exercise RX and with personalized nutrition plan. Value goals: LDL < $Rem'70mg'Vkhg$ , HDL > 40 mg.           Education:Diabetes - Individual verbal and written instruction to review signs/symptoms of diabetes, desired ranges of glucose level fasting, after meals and with exercise. Acknowledge that pre and post exercise glucose checks will be done for 3 sessions at entry of program. [place] from 11/10/2020 in [facility] Cardiac and Pulmonary Rehab  Date 10/18/20  Educator AS  Instruction Review Code 1- Verbalizes Understanding      Core Components/Risk Factors/Patient Goals Review:   Goals and Risk Factor Review    Row Name 11/10/20 0809 12/08/20 9924           Core Components/Risk Factors/Patient Goals Review   Personal Goals Review Weight Management/Obesity;Lipids;Diabetes;Hypertension Weight Management/Obesity;Lipids;Diabetes;Hypertension      Review Derek Dickerson is taking all medications as directed.  He does check BP and BG at home.  Today FBG was 126.  He has met with RD and is watching portion sizes.  He will have blood work to recheck cholesterol etc in a couple months. He Dickerson he has lost over 10 pounds in the hospital - likely muscle tissue - his weight has been stable while he has been at rehab. Derek Dickerson is  taking all medications as directed. He checks BP and BG at home; BG ~150, BP 140/80.  He will have blood work to recheck cholesterol etc in a couple months. He continues to work on his diet and attend rehab.      Expected Outcomes Short:  continue to take meds as directed and monitor risk factors Long:  keep risk factors managed Short:  continue to take meds as directed and monitor risk factors Long:  keep risk factors managed             Core Components/Risk Factors/Patient Goals at Discharge (Final Review):   Goals and Risk Factor Review - 12/08/20 0812      Core Components/Risk Factors/Patient Goals Review   Personal Goals Review Weight Management/Obesity;Lipids;Diabetes;Hypertension    Review He Dickerson he has lost over 10 pounds in the hospital - likely muscle tissue - his weight has been stable while he has  been at rehab. Derek Dickerson is taking all medications as directed. He checks BP and BG at home; BG ~150, BP 140/80.  He will have blood work to recheck cholesterol etc in a couple months. He continues to work on his diet and attend rehab.    Expected Outcomes Short:  continue to take meds as directed and monitor risk factors Long:  keep risk factors managed           ITP Comments:  ITP Comments    Row Name 10/13/20 1540 10/18/20 1646 11/01/20 0836 11/03/20 0957 11/08/20 1114   ITP Comments Virtual Visit completed. Patient informed on EP and RD appointment and 6 Minute walk test. Patient also informed of patient health questionnaires on My Chart. Patient Verbalizes understanding. Visit diagnosis can be found in [facility]12/22/2021. Completed 6MWT and gym orientation. Initial ITP created and sent for review to Dr. Emily Filbert, Medical Director. First full day of exercise!  Patient was oriented to gym and equipment including functions, settings, policies, and procedures.  Patient's individual exercise prescription and treatment plan were reviewed.  All starting workloads were established based on the  results of the 6 minute walk test done at initial orientation visit.  The plan for exercise progression was also introduced and progression will be customized based on patient's performance and goals. 30 Day review completed. Medical Director ITP review done, changes made as directed, and signed approval by Medical Director.  New to program Completed initial RD evaluation   Row Name 11/23/20 1537 12/01/20 0713 12/02/20 1309 12/08/20 0808 12/22/20 1031   ITP Comments Pt called out sick last week. Currently admitted to [facility] on 2/11 with pancreatitis. On going fever >10 days not responding to antibiotics 30 Day review completed. Medical Director ITP review done, changes made as directed, and signed approval by Medical Director.  REmains out for medical reasons Called to check in on pt.  He is home and seen in f/u.  Out for week.  He is scheduled to return on Wed 3/2. Patient returned today and provided letter from MD illustrating approval and safety to continue with rehab. Pt called out today with a fever again.   [place] Name 12/29/20 1127           ITP Comments 30 Day review completed. Medical Director ITP review done, changes made as directed, and signed approval by Medical Director.              Comments:

## 2020-12-31 ENCOUNTER — Other Ambulatory Visit: Payer: Self-pay

## 2020-12-31 ENCOUNTER — Encounter: Payer: Medicare Other | Admitting: *Deleted

## 2020-12-31 DIAGNOSIS — Z955 Presence of coronary angioplasty implant and graft: Secondary | ICD-10-CM | POA: Diagnosis not present

## 2020-12-31 DIAGNOSIS — Z9861 Coronary angioplasty status: Secondary | ICD-10-CM

## 2020-12-31 NOTE — Progress Notes (Signed)
Daily Session Note  Patient Details  Name: Derek Dickerson MRN: [id number] Date of Birth: 1961-06-02 Referring Provider:   Flowsheet Row Cardiac Rehab from 10/18/2020 in [facility] Cardiac and Pulmonary Rehab  Referring Provider [name]      Encounter Date: 12/31/2020  Check In:  Session Check In - 12/31/20 0842      Check-In   Supervising physician immediately available to respond to emergencies See telemetry face sheet for immediately available ER MD    Location [facility]-Cardiac & Pulmonary Rehab    Staff Present Heath Lark, RN, BSN, CCRP;Jessica [place], MA, RCEP, CCRP, CCET;Joseph [place] RCP,RRT,BSRT    Virtual Visit No    Medication changes reported     No    Fall or balance concerns reported    No    Warm-up and Cool-down Performed on first and last piece of equipment    Resistance Training Performed Yes    VAD Patient? No    PAD/SET Patient? No      Pain Assessment   Currently in Pain? No/denies              Social History   Tobacco Use  Smoking Status Former Smoker  . Packs/day: 1.00  . Years: 40.00  . Pack years: 40.00  . Types: Cigarettes  . Quit date: 2013  . Years since quitting: 9.2  Smokeless Tobacco Never Used    Goals Met:  Independence with exercise equipment Exercise tolerated well No report of cardiac concerns or symptoms  Goals Unmet:  Not Applicable  Comments: Pt able to follow exercise prescription today without complaint.  Will continue to monitor for progression.    Dr. Emily Filbert is Medical Director for [place] and [facility].

## 2021-01-03 ENCOUNTER — Encounter: Payer: Medicare Other | Admitting: *Deleted

## 2021-01-03 ENCOUNTER — Other Ambulatory Visit: Payer: Self-pay

## 2021-01-03 DIAGNOSIS — Z955 Presence of coronary angioplasty implant and graft: Secondary | ICD-10-CM | POA: Diagnosis not present

## 2021-01-03 DIAGNOSIS — Z9861 Coronary angioplasty status: Secondary | ICD-10-CM

## 2021-01-03 NOTE — Progress Notes (Signed)
Daily Session Note  Patient Details  Name: Derek Dickerson MRN: [id number] Date of Birth: 1961/03/02 Referring Provider:   Flowsheet Row Cardiac Rehab from 10/18/2020 in [facility] Cardiac and Pulmonary Rehab  Referring Provider [name]      Encounter Date: 01/03/2021  Check In:  Session Check In - 01/03/21 0845      Check-In   Supervising physician immediately available to respond to emergencies See telemetry face sheet for immediately available ER MD    Location [facility]-Cardiac & Pulmonary Rehab    Staff Present Heath Lark, RN, BSN, CCRP;Joseph [place] RCP,RRT,BSRT;Kelly [place], [place], ACSM CEP, Exercise Physiologist    Virtual Visit No    Medication changes reported     No    Fall or balance concerns reported    No    Warm-up and Cool-down Performed on first and last piece of equipment    Resistance Training Performed Yes    VAD Patient? No    PAD/SET Patient? No      Pain Assessment   Currently in Pain? No/denies              Social History   Tobacco Use  Smoking Status Former Smoker  . Packs/day: 1.00  . Years: 40.00  . Pack years: 40.00  . Types: Cigarettes  . Quit date: 2013  . Years since quitting: 9.2  Smokeless Tobacco Never Used    Goals Met:  Independence with exercise equipment Exercise tolerated well No report of cardiac concerns or symptoms  Goals Unmet:  Not Applicable  Comments: Pt able to follow exercise prescription today without complaint.  Will continue to monitor for progression.    Dr. Emily Filbert is Medical Director for [place] and [facility].

## 2021-01-07 ENCOUNTER — Other Ambulatory Visit: Payer: Self-pay

## 2021-01-07 ENCOUNTER — Encounter: Payer: Medicare Other | Attending: Cardiology | Admitting: *Deleted

## 2021-01-07 DIAGNOSIS — Z9861 Coronary angioplasty status: Secondary | ICD-10-CM | POA: Diagnosis present

## 2021-01-07 NOTE — Progress Notes (Signed)
Daily Session Note  Patient Details  Name: Kevin Space MRN: [id number] Date of Birth: 07-29-1961 Referring Provider:   Flowsheet Row Cardiac Rehab from 10/18/2020 in [facility] Cardiac and Pulmonary Rehab  Referring Provider [name]      Encounter Date: 01/07/2021  Check In:  Session Check In - 01/07/21 0958      Check-In   Supervising physician immediately available to respond to emergencies See telemetry face sheet for immediately available ER MD    Location [facility]-Cardiac & Pulmonary Rehab    Staff Present Heath Lark, RN, BSN, CCRP;Jessica [place], MA, RCEP, CCRP, CCET;Joseph [place] RCP,RRT,BSRT    Virtual Visit No    Medication changes reported     No    Fall or balance concerns reported    No    Warm-up and Cool-down Performed on first and last piece of equipment    Resistance Training Performed Yes    VAD Patient? No    PAD/SET Patient? No      Pain Assessment   Currently in Pain? No/denies              Social History   Tobacco Use  Smoking Status Former Smoker  . Packs/day: 1.00  . Years: 40.00  . Pack years: 40.00  . Types: Cigarettes  . Quit date: 2013  . Years since quitting: 9.2  Smokeless Tobacco Never Used    Goals Met:  Independence with exercise equipment Exercise tolerated well No report of cardiac concerns or symptoms  Goals Unmet:  Not Applicable  Comments: Pt able to follow exercise prescription today without complaint.  Will continue to monitor for progression.    Dr. Emily Filbert is Medical Director for [place] and [facility].

## 2021-01-14 ENCOUNTER — Encounter: Payer: Medicare Other | Admitting: *Deleted

## 2021-01-14 ENCOUNTER — Other Ambulatory Visit: Payer: Self-pay

## 2021-01-14 DIAGNOSIS — Z9861 Coronary angioplasty status: Secondary | ICD-10-CM

## 2021-01-14 NOTE — Progress Notes (Signed)
Daily Session Note  Patient Details  Name: Jaquese Irving MRN: [id number] Date of Birth: 01/02/1961 Referring Provider:   Flowsheet Row Cardiac Rehab from 10/18/2020 in [facility] Cardiac and Pulmonary Rehab  Referring Provider [name]      Encounter Date: 01/14/2021  Check In:  Session Check In - 01/14/21 0842      Check-In   Supervising physician immediately available to respond to emergencies See telemetry face sheet for immediately available ER MD    Location [facility]-Cardiac & Pulmonary Rehab    Staff Present Heath Lark, RN, BSN, CCRP;Jessica [place], MA, RCEP, CCRP, CCET;Joseph [place] RCP,RRT,BSRT    Virtual Visit No    Medication changes reported     No    Fall or balance concerns reported    No    Warm-up and Cool-down Performed on first and last piece of equipment    Resistance Training Performed Yes    VAD Patient? No    PAD/SET Patient? No      Pain Assessment   Currently in Pain? No/denies              Social History   Tobacco Use  Smoking Status Former Smoker  . Packs/day: 1.00  . Years: 40.00  . Pack years: 40.00  . Types: Cigarettes  . Quit date: 2013  . Years since quitting: 9.2  Smokeless Tobacco Never Used    Goals Met:  Independence with exercise equipment Exercise tolerated well No report of cardiac concerns or symptoms  Goals Unmet:  Not Applicable  Comments: Pt able to follow exercise prescription today without complaint.  Will continue to monitor for progression.    Dr. Emily Filbert is Medical Director for [place] [facility].

## 2021-01-17 ENCOUNTER — Other Ambulatory Visit: Payer: Self-pay

## 2021-01-17 ENCOUNTER — Encounter: Payer: Medicare Other | Admitting: *Deleted

## 2021-01-17 DIAGNOSIS — Z9861 Coronary angioplasty status: Secondary | ICD-10-CM

## 2021-01-17 NOTE — Progress Notes (Signed)
Daily Session Note  Patient Details  Name: Derek Dickerson MRN: [id number] Date of Birth: 10/05/1961 Referring Provider:   Flowsheet Row Cardiac Rehab from 10/18/2020 in [facility] Cardiac and Pulmonary Rehab  Referring Provider [name]      Encounter Date: 01/17/2021  Check In:  Session Check In - 01/17/21 0838      Check-In   Supervising physician immediately available to respond to emergencies See telemetry face sheet for immediately available ER MD    Location [facility]-Cardiac & Pulmonary Rehab    Staff Present Heath Lark, RN, BSN, CCRP;Joseph Hood RCP,RRT,BSRT;Kelly [place], [place], ACSM CEP, Exercise Physiologist    Virtual Visit No    Medication changes reported     No    Fall or balance concerns reported    No    Warm-up and Cool-down Performed on first and last piece of equipment    Resistance Training Performed Yes    VAD Patient? No    PAD/SET Patient? No      Pain Assessment   Currently in Pain? No/denies              Social History   Tobacco Use  Smoking Status Former Smoker  . Packs/day: 1.00  . Years: 40.00  . Pack years: 40.00  . Types: Cigarettes  . Quit date: 2013  . Years since quitting: 9.2  Smokeless Tobacco Never Used    Goals Met:  Independence with exercise equipment Exercise tolerated well No report of cardiac concerns or symptoms  Goals Unmet:  Not Applicable  Comments: Pt able to follow exercise prescription today without complaint.  Will continue to monitor for progression.    Dr. Emily Filbert is Medical Director for [place] and [facility].

## 2021-01-19 ENCOUNTER — Other Ambulatory Visit: Payer: Self-pay

## 2021-01-19 DIAGNOSIS — Z9861 Coronary angioplasty status: Secondary | ICD-10-CM | POA: Diagnosis not present

## 2021-01-19 NOTE — Progress Notes (Signed)
Daily Session Note  Patient Details  Name: Derek Dickerson MRN: [id number] Date of Birth: 29-Jul-1961 Referring Provider:   Flowsheet Row Cardiac Rehab from 10/18/2020 in [facility] Cardiac and Pulmonary Rehab  Referring Provider [name]      Encounter Date: 01/19/2021  Check In:  Session Check In - 01/19/21 0747      Check-In   Supervising physician immediately available to respond to emergencies See telemetry face sheet for immediately available ER MD    Location [facility]-Cardiac & Pulmonary Rehab    Staff Present Birdie Sons, MPA, Elveria Rising, BA, ACSM CEP, Exercise Physiologist;Joseph Tessie Fass RCP,RRT,BSRT    Virtual Visit No    Medication changes reported     No    Fall or balance concerns reported    No    Warm-up and Cool-down Performed on first and last piece of equipment    Resistance Training Performed Yes    VAD Patient? No    PAD/SET Patient? No      Pain Assessment   Currently in Pain? No/denies              Social History   Tobacco Use  Smoking Status Former Smoker  . Packs/day: 1.00  . Years: 40.00  . Pack years: 40.00  . Types: Cigarettes  . Quit date: 2013  . Years since quitting: 9.2  Smokeless Tobacco Never Used    Goals Met:  Independence with exercise equipment Exercise tolerated well No report of cardiac concerns or symptoms Strength training completed today  Goals Unmet:  Not Applicable  Comments: Pt able to follow exercise prescription today without complaint.  Will continue to monitor for progression.    Dr. Emily Filbert is Medical Director for [place] and [facility].

## 2021-01-24 ENCOUNTER — Other Ambulatory Visit: Payer: Self-pay

## 2021-01-24 ENCOUNTER — Encounter: Payer: Medicare Other | Admitting: *Deleted

## 2021-01-24 DIAGNOSIS — Z9861 Coronary angioplasty status: Secondary | ICD-10-CM

## 2021-01-24 NOTE — Progress Notes (Signed)
Daily Session Note  Patient Details  Name: Clearance Chenault MRN: [id number] Date of Birth: 1961-01-15 Referring Provider:   Flowsheet Row Cardiac Rehab from 10/18/2020 in [facility] Card[facility]  Referring Provider [name]      Encounter Date: 01/24/2021  Check In:  Session Check In - 01/24/21 0840      Check-In   Supervising physician immediately available to respond to emergencies See telemetry face sheet for immediately available ER MD    Location [facility]-Cardiac & Pulmonary Rehab    Staff Present Heath Lark, RN, BSN, CCRP;Joseph Hood RCP,RRT,BSRT;[place], [place], ACSM CEP, Exercise Physiologist    Virtual Visit No    Medication changes reported     No    Fall or balance concerns reported    No    Warm-up and Cool-down Performed on first and last piece of equipment    Resistance Training Performed Yes    VAD Patient? No    PAD/SET Patient? No      Pain Assessment   Currently in Pain? No/denies              Social History   Tobacco Use  Smoking Status Former Smoker  . Packs/day: 1.00  . Years: 40.00  . Pack years: 40.00  . Types: Cigarettes  . Quit date: 2013  . Years since quitting: 9.2  Smokeless Tobacco Never Used    Goals Met:  Independence with exercise equipment Exercise tolerated well No report of cardiac concerns or symptoms  Goals Unmet:  Not Applicable  Comments: Pt able to follow exercise prescription today without complaint.  Will continue to monitor for progression. Blood sugar started dropping. Had juice with resolution of symptoms and then ate a pack of crackers.   Dr. Emily Filbert is Medical Director for [place] and [facility].

## 2021-01-26 ENCOUNTER — Encounter: Payer: Self-pay | Admitting: *Deleted

## 2021-01-26 DIAGNOSIS — Z9861 Coronary angioplasty status: Secondary | ICD-10-CM

## 2021-01-26 NOTE — Progress Notes (Signed)
Cardiac Individual Treatment Plan  Patient Details  Name: Derek Dickerson MRN: [id number] Date of Birth: [birth year] Referring Provider:   Flowsheet Row Cardiac Rehab from 10/18/2020 in [facility] Cardiac and Pulmonary Rehab  Referring Provider [name]      Initial Encounter Date:  Flowsheet Row Cardiac Rehab from 10/18/2020 in [facility] Cardiac and Pulmonary Rehab  Date 10/18/20      Visit Diagnosis: S/P PTCA (percutaneous transluminal coronary angioplasty)  Patient's Home Medications on Admission:  Current Outpatient Medications:  .  aspirin 81 MG EC tablet, Take by mouth., Disp: , Rfl:  .  Blood Glucose Monitoring Suppl (FIFTY50 GLUCOSE METER 2.0) w/Device KIT, Tests 8 times a day, One touch Ultra 2 glucometer, E08.9, Disp: , Rfl:  .  buPROPion (WELLBUTRIN XL) 300 MG 24 hr tablet, Take by mouth., Disp: , Rfl:  .  cetirizine-pseudoephedrine (ZYRTEC-D) 5-120 MG tablet, Take by mouth., Disp: , Rfl:  .  Cholecalciferol 25 MCG (1000 UT) tablet, Take by mouth., Disp: , Rfl:  .  clopidogrel (PLAVIX) 75 MG tablet, Take by mouth., Disp: , Rfl:  .  EPINEPHrine 0.3 mg/0.3 mL IJ SOAJ injection, Inject into the muscle., Disp: , Rfl:  .  ferrous sulfate 325 (65 FE) MG tablet, Take by mouth., Disp: , Rfl:  .  gabapentin (NEURONTIN) 100 MG capsule, Take by mouth., Disp: , Rfl:  .  glucose blood (PRECISION QID TEST) test strip, Use one. 5 (five) to 8 (eight) times daily as needed. Use as instructed. ONE TOUCH ULTRA, Disp: , Rfl:  .  insulin glargine (LANTUS) 100 UNIT/ML Solostar Pen, Inject into the skin., Disp: , Rfl:  .  insulin lispro (HUMALOG) 100 UNIT/ML KwikPen, INJECT 20-30 UNITS WITH MEALS OR SNACKS UP TO FIVE TIMES DAILY (MAX OF 140 UNITS PER DAY), Disp: , Rfl:  .  Insulin Pen Needle (B-D UF III MINI PEN NEEDLES) 31G X 5 MM MISC, USE WITH INSULIN 7 TIMES DAILY, Disp: , Rfl:  .  omeprazole (PRILOSEC) 40 MG capsule, Take by mouth., Disp: , Rfl:  .  oxyCODONE (OXY IR/ROXICODONE) 5 MG immediate release  tablet, Take by mouth., Disp: , Rfl:  .  Pancrelipase, Lip-Prot-Amyl, (CREON) 24000-76000 units CPEP, Take by mouth., Disp: , Rfl:  .  rosuvastatin (CRESTOR) 20 MG tablet, Take by mouth., Disp: , Rfl:   Past Medical History: Past Medical History:  Diagnosis Date  . Diabetes mellitus without complication ([place])     Tobacco Use: Social History   Tobacco Use  Smoking Status Former Smoker  . Packs/day: 1.00  . Years: 40.00  . Pack years: 40.00  . Types: Cigarettes  . Quit date: 2013  . Years since quitting: 9.3  Smokeless Tobacco Never Used    Labs: Recent Review Flowsheet Data   There is no flowsheet data to display.      Exercise Target Goals: Exercise Program Goal: Individual exercise prescription set using results from initial 6 min walk test and THRR while considering  patient's activity barriers and safety.   Exercise Prescription Goal: Initial exercise prescription builds to 30-45 minutes a day of aerobic activity, 2-3 days per week.  Home exercise guidelines will be given to patient during program as part of exercise prescription that the participant will acknowledge.   Education: Aerobic Exercise: - Group verbal and visual presentation on the components of exercise prescription. Introduces F.I.T.T principle from ACSM for exercise prescriptions.  Reviews F.I.T.T. principles of aerobic exercise including progression. Written material given at graduation.   Education: Resistance Exercise: -  Group verbal and visual presentation on the components of exercise prescription. Introduces F.I.T.T principle from ACSM for exercise prescriptions  Reviews F.I.T.T. principles of resistance exercise including progression. Written material given at graduation.    Education: Exercise & Equipment Safety: - Individual verbal instruction and demonstration of equipment use and safety with use of the equipment. Flowsheet Row Cardiac Rehab from 01/19/2021 in [facility] Cardiac and Pulmonary  Rehab  Date 10/18/20  Educator AS  Instruction Review Code 1- Verbalizes Understanding      Education: Exercise Physiology & General Exercise Guidelines: - Group verbal and written instruction with models to review the exercise physiology of the cardiovascular system and associated critical values. Provides general exercise guidelines with specific guidelines to those with heart or lung disease.  Flowsheet Row Cardiac Rehab from 01/19/2021 in [facility] Cardiac and Pulmonary Rehab  Date 01/19/21  Educator [facility]  Instruction Review Code 1- Verbalizes Understanding      Education: Flexibility, Balance, Mind/Body Relaxation: - Group verbal and visual presentation with interactive activity on the components of exercise prescription. Introduces F.I.T.T principle from ACSM for exercise prescriptions. Reviews F.I.T.T. principles of flexibility and balance exercise training including progression. Also discusses the mind body connection.  Reviews various relaxation techniques to help reduce and manage stress (i.e. Deep breathing, progressive muscle relaxation, and visualization). Balance handout provided to take home. Written material given at graduation.   Activity Barriers & Risk Stratification:   6 Minute Walk:  6 Minute Walk    Row Name 10/18/20 1634         6 Minute Walk   Phase Initial     Distance 1400 feet     Walk Time 6 minutes     # of Rest Breaks 0     MPH 2.65     METS 3.3     RPE 12     Perceived Dyspnea  1     VO2 Peak 11.5     Symptoms No     Resting HR 75 bpm     Resting BP 136/72     Resting Oxygen Saturation  99 %     Exercise Oxygen Saturation  during 6 min walk 98 %     Max Ex. HR 103 bpm     Max Ex. BP 160/76     2 Minute Post BP 134/60            Oxygen Initial Assessment:   Oxygen Re-Evaluation:   Oxygen Discharge (Final Oxygen Re-Evaluation):   Initial Exercise Prescription:  Initial Exercise Prescription - 10/18/20 1600      Date of Initial  Exercise RX and Referring Provider   Date 10/18/20    Referring Provider [name]      Treadmill   MPH 2.5    Grade 0.5    Minutes 15    METs 3.3      REL-XR   Level 3    Speed 50    Minutes 15    METs 3.3      T5 Nustep   Level 2    SPM 80    Minutes 15    METs 3.3      Prescription Details   Frequency (times per week) 3    Duration Progress to 30 minutes of continuous aerobic without signs/symptoms of physical distress      Intensity   THRR 40-80% of Max Heartrate 109-142    Ratings of Perceived Exertion 11-15    Perceived Dyspnea 0-4  Resistance Training   Training Prescription Yes    Weight 4 lb    Reps 10-15           Perform Capillary Blood Glucose checks as needed.  Exercise Prescription Changes:  Exercise Prescription Changes    Row Name 10/18/20 1600 11/08/20 1600 11/10/20 0800 11/23/20 1500 12/08/20 1600     Response to Exercise   Blood Pressure (Admit) 136/72 130/82 -- 136/56 130/60   Blood Pressure (Exercise) 160/76 140/78 -- 144/70 158/58   Blood Pressure (Exit) 134/60 142/86 -- 122/60 132/74   Heart Rate (Admit) 75 bpm 76 bpm -- 75 bpm 86 bpm   Heart Rate (Exercise) 103 bpm 96 bpm -- 97 bpm 111 bpm   Heart Rate (Exit) 89 bpm 72 bpm -- 73 bpm 75 bpm   Oxygen Saturation (Admit) 99 % -- -- -- --   Oxygen Saturation (Exercise) 98 % -- -- -- --   Rating of Perceived Exertion (Exercise) 12 13 -- 14 13   Perceived Dyspnea (Exercise) 1 -- -- -- --   Symptoms none none -- none none   Comments -- -- -- from visit on 11/12/20 --   Duration -- Continue with 30 min of aerobic exercise without signs/symptoms of physical distress. -- Continue with 30 min of aerobic exercise without signs/symptoms of physical distress. Continue with 30 min of aerobic exercise without signs/symptoms of physical distress.   Intensity -- THRR unchanged -- THRR unchanged THRR unchanged     Progression   Progression -- Continue to progress workloads to maintain intensity  without signs/symptoms of physical distress. -- Continue to progress workloads to maintain intensity without signs/symptoms of physical distress. Continue to progress workloads to maintain intensity without signs/symptoms of physical distress.   Average METs -- 2.53 -- 2.63 3.5     Resistance Training   Training Prescription -- Yes -- Yes Yes   Weight -- 5 lb -- 5 lb 5 lb   Reps -- 10-15 -- 10-15 10-15     Interval Training   Interval Training -- No -- No --     Treadmill   MPH -- 2.5 -- 2.6 3   Grade -- 0.5 -- 0.5 0.5   Minutes -- 15 -- 15 15   METs -- 3.09 -- 3.17 3.5     REL-XR   Level -- 3 -- -- --   Minutes -- 15 -- -- --   METs -- 2.3 -- -- --     T5 Nustep   Level -- 2 -- 3 --   Minutes -- 15 -- 15 --   METs -- 2.2 -- 2.1 --     Home Exercise Plan   Plans to continue exercise at -- -- [place]   Frequency -- -- Add 2 additional days to program exercise sessions. Add 2 additional days to program exercise sessions. Add 2 additional days to program exercise sessions.   Initial Home Exercises Provided -- -- 11/10/20 11/10/20 11/10/20   Row Name 12/22/20 1000 01/04/21 1300 01/17/21 1400         Response to Exercise   Blood Pressure (Admit) 122/70 128/68 124/62     Blood Pressure (Exercise) 148/82 142/62 162/62     Blood Pressure (Exit) 128/70 138/64 124/60     Heart Rate (Admit) 90 bpm 69 bpm 88 bpm     Heart Rate (Exercise) 111 bpm 107 bpm 122 bpm     Heart Rate (Exit)  88 bpm 83 bpm 103 bpm     Rating of Perceived Exertion (Exercise) 15 15 15      Symptoms none none none     Duration Continue with 30 min of aerobic exercise without signs/symptoms of physical distress. Continue with 30 min of aerobic exercise without signs/symptoms of physical distress. Continue with 30 min of aerobic exercise without signs/symptoms of physical distress.     Intensity THRR unchanged THRR unchanged THRR unchanged           Progression    Progression Continue to progress workloads to maintain intensity without signs/symptoms of physical distress. Continue to progress workloads to maintain intensity without signs/symptoms of physical distress. Continue to progress workloads to maintain intensity without signs/symptoms of physical distress.     Average METs 3.27 3.5 3.73           Resistance Training   Training Prescription Yes Yes Yes     Weight 5 lb 5 lb 5 lb     Reps 10-15 10-15 10-15           Interval Training   Interval Training No No No           Treadmill   MPH 3 3 3      Grade 0.5 0.5 0.5     Minutes 15 15 15      METs 3.5 3.5 3.5           REL-XR   Level 4 -- 5     Minutes 15 -- 15     METs 4 -- 5.3           T5 Nustep   Level 2 2 2      Minutes 15 15 15      METs 2.3 -- 2.4           Home Exercise Plan   Plans to continue exercise at [place]     Frequency Add 2 additional days to program exercise sessions. Add 2 additional days to program exercise sessions. Add 2 additional days to program exercise sessions.     Initial Home Exercises Provided 11/10/20 11/10/20 11/10/20            Exercise Comments:  Exercise Comments    Row Name 11/01/20 2951 12/08/20 0809 01/24/21 0841       Exercise Comments First full day of exercise!  Patient was oriented to gym and equipment including functions, settings, policies, and procedures.  Patient's individual exercise prescription and treatment plan were reviewed.  All starting workloads were established based on the results of the 6 minute walk test done at initial orientation visit.  The plan for exercise progression was also introduced and progression will be customized based on patient's performance and goals. Patient returned today and provided letter from MD illustrating approval and safety to continue with rehab. Blood sugar started dropping. Had juice with resolution of symptoms and then ate a pack of cra             Exercise Goals and Review:  Exercise Goals    Row Name 10/18/20 1637             Exercise Goals   Increase Physical Activity Yes       Intervention Provide advice, education, support and counseling about physical activity/exercise needs.;Develop an individualized exercise prescription for aerobic and resistive training based on initial evaluation findings, risk stratification, comorbidities and participant's personal goals.  Expected Outcomes Short Term: Attend rehab on a regular basis to increase amount of physical activity.;Long Term: Add in home exercise to make exercise part of routine and to increase amount of physical activity.;Long Term: Exercising regularly at least 3-5 days a week.       Increase Strength and Stamina Yes       Intervention Provide advice, education, support and counseling about physical activity/exercise needs.       Expected Outcomes Short Term: Increase workloads from initial exercise prescription for resistance, speed, and METs.;Short Term: Perform resistance training exercises routinely during rehab and add in resistance training at home;Long Term: Improve cardiorespiratory fitness, muscular endurance and strength as measured by increased METs and functional capacity (6MWT)       Able to understand and use rate of perceived exertion (RPE) scale Yes       Intervention Provide education and explanation on how to use RPE scale       Expected Outcomes Short Term: Able to use RPE daily in rehab to express subjective intensity level;Long Term:  Able to use RPE to guide intensity level when exercising independently       Able to understand and use Dyspnea scale Yes       Intervention Provide education and explanation on how to use Dyspnea scale       Expected Outcomes Short Term: Able to use Dyspnea scale daily in rehab to express subjective sense of shortness of breath during exertion;Long Term: Able to use Dyspnea scale to guide intensity level when  exercising independently       Knowledge and understanding of Target Heart Rate Range (THRR) Yes       Intervention Provide education and explanation of THRR including how the numbers were predicted and where they are located for reference       Expected Outcomes Short Term: Able to state/look up THRR;Short Term: Able to use daily as guideline for intensity in rehab;Long Term: Able to use THRR to govern intensity when exercising independently       Able to check pulse independently Yes       Intervention Provide education and demonstration on how to check pulse in carotid and radial arteries.;Review the importance of being able to check your own pulse for safety during independent exercise       Expected Outcomes Short Term: Able to explain why pulse checking is important during independent exercise;Long Term: Able to check pulse independently and accurately       Understanding of Exercise Prescription Yes       Intervention Provide education, explanation, and written materials on patient's individual exercise prescription       Expected Outcomes Short Term: Able to explain program exercise prescription;Long Term: Able to explain home exercise prescription to exercise independently              Exercise Goals Re-Evaluation :  Exercise Goals Re-Evaluation    Row Name 11/01/20 0836 11/08/20 1629 11/10/20 0829 11/23/20 1543 12/08/20 0813     Exercise Goal Re-Evaluation   Exercise Goals Review Able to understand and use rate of perceived exertion (RPE) scale;Able to understand and use Dyspnea scale;Knowledge and understanding of Target Heart Rate Range (THRR);Understanding of Exercise Prescription Increase Physical Activity;Increase Strength and Stamina;Understanding of Exercise Prescription Increase Physical Activity;Increase Strength and Stamina;Able to understand and use rate of perceived exertion (RPE) scale;Knowledge and understanding of Target Heart Rate Range (THRR);Able to check pulse  independently;Understanding of Exercise Prescription Increase Physical Activity;Increase Strength and  Stamina;Understanding of Exercise Prescription Increase Physical Activity;Increase Strength and Stamina;Understanding of Exercise Prescription   Comments Reviewed RPE and dyspnea scales, THR and program prescription with pt today.  Pt voiced understanding and was given a copy of goals to take home. Ronalee Belts is off to a good start in rehab.  He has completed his first three full days of exercise already.  We will continue to monitor his progress. Reviewed home exercise with pt today.  Pt plans to walk and use stationary bike for exercise.  Reviewed THR, pulse, RPE, sign and symptoms, pulse oximetery and when to call 911 or MD.  Also discussed weather considerations and indoor options.  Pt voiced understanding. Only attended once since last review.  Currently in hospital He has a recumbant bike he uses (hasn't in the last week or so). He was doing it 3-4x/week and 30 minutes (warm up and cool down included) RPE 13-15.   Expected Outcomes Short: Use RPE daily to regulate intensity. Long: Follow program prescription in THR. Short: Continue to attend rehab regularly  Long: Continue to follow program prescription. Short: add 2 days of exercise outside program sessions Long:  improve overall MET level Short: Clearance to return once discharged from hospital Long: Return to regular attendance ST: return to exercising at home using his recumbant bike LT: continue regular attendance at rehab, exercise at home when not at rehab regularly   [place] Name 12/22/20 1031 01/04/21 1304 01/17/21 1402         Exercise Goal Re-Evaluation   Exercise Goals Review Increase Physical Activity;Increase Strength and Stamina;Understanding of Exercise Prescription Increase Physical Activity;Increase Strength and Stamina Increase Physical Activity;Increase Strength and Stamina;Understanding of Exercise Prescription     Comments Ronalee Belts has been  doing well in rehab.  He is up to level 4 on the XR.  He called out today with another fever.  We will continue to monitor his progress. Ronalee Belts has increased to level 5 on XR and is at 3 mph and .5 % on TM.  He does reach lower end of THR range.  We will continue to monitor progress. Ronalee Belts is doing well in rehab.  He is up to 5.3 METs on the XR.  We will continue to monitor his progress.     Expected Outcomes Short: Figure out fever Long: Continue to improve stmaina. Short: attend consistently Long: build stamina overall Short: Increase workloads on T5 NuStep Long: Continue to improve stamina            Discharge Exercise Prescription (Final Exercise Prescription Changes):  Exercise Prescription Changes - 01/17/21 1400      Response to Exercise   Blood Pressure (Admit) 124/62    Blood Pressure (Exercise) 162/62    Blood Pressure (Exit) 124/60    Heart Rate (Admit) 88 bpm    Heart Rate (Exercise) 122 bpm    Heart Rate (Exit) 103 bpm    Rating of Perceived Exertion (Exercise) 15    Symptoms none    Duration Continue with 30 min of aerobic exercise without signs/symptoms of physical distress.    Intensity THRR unchanged      Progression   Progression Continue to progress workloads to maintain intensity without signs/symptoms of physical distress.    Average METs 3.73      Resistance Training   Training Prescription Yes    Weight 5 lb    Reps 10-15      Interval Training   Interval Training No      Treadmill  MPH 3    Grade 0.5    Minutes 15    METs 3.5      REL-XR   Level 5    Minutes 15    METs 5.3      T5 Nustep   Level 2    Minutes 15    METs 2.4      Home Exercise Plan   Plans to continue exercise at [facility]    Frequency Add 2 additional days to program exercise sessions.    Initial Home Exercises Provided 11/10/20           Nutrition:  Target Goals: Understanding of nutrition guidelines, daily intake of sodium '1500mg'$ , cholesterol '200mg'$ ,  calories 30% from fat and 7% or less from saturated fats, daily to have 5 or more servings of fruits and vegetables.  Education: All About Nutrition: -Group instruction provided by verbal, written material, interactive activities, discussions, models, and posters to present general guidelines for heart healthy nutrition including fat, fiber, MyPlate, the role of sodium in heart healthy nutrition, utilization of the nutrition label, and utilization of this knowledge for meal planning. Follow up email sent as well. Written material given at graduation.   Biometrics:  Pre Biometrics - 10/18/20 1637      Pre Biometrics   Height 5' 10.75" (1.797 m)    Weight 260 lb 8 oz (118.2 kg)    BMI (Calculated) 36.59            Nutrition Therapy Plan and Nutrition Goals:  Nutrition Therapy & Goals - 11/08/20 1047      Nutrition Therapy   Diet Heart healthy, low Na, diabetes friendly    Drug/Food Interactions Statins/Certain Fruits    Protein (specify units) 95g    Fiber 30 grams    Whole Grain Foods 3 servings    Saturated Fats 12 max. grams    Fruits and Vegetables 8 servings/day    Sodium 1.5 grams      Personal Nutrition Goals   Nutrition Goal ST: prepare lunches, prepare dinner 3x/week LT: cook/prepare most meals at home    Comments A1C 8 (January 2022). Insulin - short acting and long lasting. When his BG gets too low he uses orange juice. He feels he eats too much fatty foods. B: 3 tbsp - vanilla yogurt with 1/2 cup granola L: frozen meals D: fast food, frozen foods S: potato chips Drinks: diet soda and sweet tea (splenda). Discussed heart healthy eating and diabetes friendly eating. He feels he does not have a good diet and would like to start preparing his own meals as he feels that was the barrier for him to eat healthier.      Intervention Plan   Intervention Prescribe, educate and counsel regarding individualized specific dietary modifications aiming towards targeted core components  such as weight, hypertension, lipid management, diabetes, heart failure and other comorbidities.;Nutrition handout(s) given to patient.    Expected Outcomes Short Term Goal: Understand basic principles of dietary content, such as calories, fat, sodium, cholesterol and nutrients.;Long Term Goal: Adherence to prescribed nutrition plan.;Short Term Goal: A plan has been developed with personal nutrition goals set during dietitian appointment.           Nutrition Assessments:  MEDIFICTS Score Key:  ?70 Need to make dietary changes   40-70 Heart Healthy Diet  ? 40 Therapeutic Level Cholesterol Diet  Flowsheet Row Cardiac Rehab from 10/18/2020 in [facility] Cardiac and Pulmonary Rehab  Picture Your Plate Total Score on  Admission 59     Picture Your Plate Scores:  <19 Unhealthy dietary pattern with much room for improvement.  41-50 Dietary pattern unlikely to meet recommendations for good health and room for improvement.  51-60 More healthful dietary pattern, with some room for improvement.   >60 Healthy dietary pattern, although there may be some specific behaviors that could be improved.    Nutrition Goals Re-Evaluation:  Nutrition Goals Re-Evaluation    [place] Name 12/08/20 0818 01/14/21 0801           Goals   Current Weight -- 258 lb (117 kg)      Nutrition Goal ST: prepare lunches, prepare dinner 3x/week LT: cook/prepare most meals at home Lose more weight. Weight goal of 215lbs.      Comment He reports eating more fruits and vegetables (apples, oranges, salads) and is eating less red meat (1-2x/week now). He is preparring two meals at home now, he reports no barriers at this time and would like to continue working towards his goal. Ronalee Belts states that he has to take a bit of insulin which is making it hard to lose weight. He is trying to eat smaller meals throughout the day.      Expected Outcome ST: prepare lunches, prepare dinner 3x/week LT: cook/prepare most meals at home Short: lose 5  pounds in two weeks. Long: reach a weight of 215lbs             Nutrition Goals Discharge (Final Nutrition Goals Re-Evaluation):  Nutrition Goals Re-Evaluation - 01/14/21 0801      Goals   Current Weight 258 lb (117 kg)    Nutrition Goal Lose more weight. Weight goal of 215lbs.    Comment Ronalee Belts states that he has to take a bit of insulin which is making it hard to lose weight. He is trying to eat smaller meals throughout the day.    Expected Outcome Short: lose 5 pounds in two weeks. Long: reach a weight of 215lbs           Psychosocial: Target Goals: Acknowledge presence or absence of significant depression and/or stress, maximize coping skills, provide positive support system. Participant is able to verbalize types and ability to use techniques and skills needed for reducing stress and depression.   Education: Stress, Anxiety, and Depression - Group verbal and visual presentation to define topics covered.  Reviews how body is impacted by stress, anxiety, and depression.  Also discusses healthy ways to reduce stress and to treat/manage anxiety and depression.  Written material given at graduation. Flowsheet Row Cardiac Rehab from 01/19/2021 in [facility] Cardiac and Pulmonary Rehab  Date 11/10/20  Educator [facility]  Instruction Review Code 1- Verbalizes Understanding      Education: Sleep Hygiene -Provides group verbal and written instruction about how sleep can affect your health.  Define sleep hygiene, discuss sleep cycles and impact of sleep habits. Review good sleep hygiene tips.    Initial Review & Psychosocial Screening:  Initial Psych Review & Screening - 10/13/20 1542      Initial Review   Current issues with History of Depression;Current Depression      Family Dynamics   Good Support System? Yes    Comments He can look to his wife and his three dogs.He has a posiive outlook on his health. Ronalee Belts takes medication for his mood and states that it helps.      Barriers    Psychosocial barriers to participate in program The patient should benefit from training in  stress management and relaxation.      Screening Interventions   Interventions Program counselor consult;To provide support and resources with identified psychosocial needs;Encouraged to exercise;Provide feedback about the scores to participant           Quality of Life Scores:   Quality of Life - 10/18/20 1642      Quality of Life   Select Quality of Life      Quality of Life Scores   Health/Function Pre 20.57 %    Socioeconomic Pre 26.57 %    Psych/Spiritual Pre 30 %    Family Pre 28.5 %    GLOBAL Pre 24.94 %          Scores of 19 and below usually indicate a poorer quality of life in these areas.  A difference of  2-3 points is a clinically meaningful difference.  A difference of 2-3 points in the total score of the Quality of Life Index has been associated with significant improvement in overall quality of life, self-image, physical symptoms, and general health in studies assessing change in quality of life.  PHQ-9: Recent Review Flowsheet Data    Depression screen [facility] 2/9 10/18/2020   Decreased Interest 0   Down, Depressed, Hopeless 0   PHQ - 2 Score 0   Altered sleeping 0   Tired, decreased energy 0   Change in appetite 0   Feeling bad or failure about yourself  0   Trouble concentrating 0   Moving slowly or fidgety/restless 0   Suicidal thoughts 0   PHQ-9 Score 0     Interpretation of Total Score  Total Score Depression Severity:  1-4 = Minimal depression, 5-9 = Mild depression, 10-14 = Moderate depression, 15-19 = Moderately severe depression, 20-27 = Severe depression   Psychosocial Evaluation and Intervention:  Psychosocial Evaluation - 10/13/20 1545      Psychosocial Evaluation & Interventions   Interventions Encouraged to exercise with the program and follow exercise prescription;Relaxation education;Stress management education    Comments He can look to his wife  and his three dogs.He has a posiive outlook on his health. Ronalee Belts takes medication for his mood and states that it helps.    Expected Outcomes Short: Exercise regularly to support mental health and notify staff of any changes. Long: maintain mental health and well being through teaching of rehab or prescribed medications independently.    Continue Psychosocial Services  Follow up required by staff           Psychosocial Re-Evaluation:  Psychosocial Re-Evaluation    [place]amon Name 11/10/20 [phone] 12/08/20 0817 01/14/21 0804         Psychosocial Re-Evaluation   Current issues with -- -- None Identified     Comments Ronalee Belts reports no stress, depression or anxiety symptoms.  He sleeps well most of the time. Ronalee Belts reports no stress, depression or anxiety symptoms.  He sleeps well most of the time: getting about 8 hours of sleep - he is up about every 2 hours to go to the bathroom. Patient reports no issues with their current mental states, sleep, stress, depression or anxiety. Will follow up with patient in a few weeks for any changes.     Expected Outcomes Short: continue to attend [facility] to help manage  stress/sleep Long: maintain positive outlook Short: continue to attend [facility] to help manage  stress/sleep Long: maintain positive outlook Short: Continue to exercise regularly to support mental health and notify staff of any changes. Long: maintain  mental health and well being through teaching of rehab or prescribed medications independently.     Interventions -- Encouraged to attend Cardiac Rehabilitation for the exercise Encouraged to attend Cardiac Rehabilitation for the exercise     Continue Psychosocial Services  -- Follow up required by staff Follow up required by staff            Psychosocial Discharge (Final Psychosocial Re-Evaluation):  Psychosocial Re-Evaluation - 01/14/21 0804      Psychosocial Re-Evaluation   Current issues with None Identified    Comments Patient reports no  issues with their current mental states, sleep, stress, depression or anxiety. Will follow up with patient in a few weeks for any changes.    Expected Outcomes Short: Continue to exercise regularly to support mental health and notify staff of any changes. Long: maintain mental health and well being through teaching of rehab or prescribed medications independently.    Interventions Encouraged to attend Cardiac Rehabilitation for the exercise    Continue Psychosocial Services  Follow up required by staff           Vocational Rehabilitation: Provide vocational rehab assistance to qualifying candidates.   Vocational Rehab Evaluation & Intervention:   Education: Education Goals: Education classes will be provided on a variety of topics geared toward better understanding of heart health and risk factor modification. Participant will state understanding/return demonstration of topics presented as noted by education test scores.  Learning Barriers/Preferences:  Learning Barriers/Preferences - 10/13/20 1541      Learning Barriers/Preferences   Learning Barriers None    Learning Preferences None           General Cardiac Education Topics:  AED/CPR: - Group verbal and written instruction with the use of models to demonstrate the basic use of the AED with the basic ABC's of resuscitation.   Anatomy and Cardiac Procedures: - Group verbal and visual presentation and models provide information about basic cardiac anatomy and function. Reviews the testing methods done to diagnose heart disease and the outcomes of the test results. Describes the treatment choices: Medical Management, Angioplasty, or Coronary Bypass Surgery for treating various heart conditions including Myocardial Infarction, Angina, Valve Disease, and Cardiac Arrhythmias.  Written material given at graduation.   Medication Safety: - Group verbal and visual instruction to review commonly prescribed medications for heart and  lung disease. Reviews the medication, class of the drug, and side effects. Includes the steps to properly store meds and maintain the prescription regimen.  Written material given at graduation.   Intimacy: - Group verbal instruction through game format to discuss how heart and lung disease can affect sexual intimacy. Written material given at graduation..   Know Your Numbers and Heart Failure: - Group verbal and visual instruction to discuss disease risk factors for cardiac and pulmonary disease and treatment options.  Reviews associated critical values for Overweight/Obesity, Hypertension, Cholesterol, and Diabetes.  Discusses basics of heart failure: signs/symptoms and treatments.  Introduces Heart Failure Zone chart for action plan for heart failure.  Written material given at graduation.   Infection Prevention: - Provides verbal and written material to individual with discussion of infection control including proper hand washing and proper equipment cleaning during exercise session. Flowsheet Row Cardiac Rehab from 01/19/2021 in [facility] Cardiac and Pulmonary Rehab  Date 10/18/20  Educator AS  Instruction Review Code 1- Verbalizes Understanding      Falls Prevention: - Provides verbal and written material to individual with discussion of falls prevention and safety. Flowsheet Row  Cardiac Rehab from 01/19/2021 in [facility] Cardiac and Pulmonary Rehab  Date 10/18/20  Educator AS  Instruction Review Code 1- Verbalizes Understanding      Other: -Provides group and verbal instruction on various topics (see comments)   Knowledge Questionnaire Score:  Knowledge Questionnaire Score - 10/18/20 1639      Knowledge Questionnaire Score   Pre Score 21/26 exercise nutrition           Core Components/Risk Factors/Patient Goals at Admission:  Personal Goals and Risk Factors at Admission - 10/18/20 1643      Core Components/Risk Factors/Patient Goals on Admission    Weight Management Weight  Loss;Yes    Intervention Weight Management: Develop a combined nutrition and exercise program designed to reach desired caloric intake, while maintaining appropriate intake of nutrient and fiber, sodium and fats, and appropriate energy expenditure required for the weight goal.;Weight Management: Provide education and appropriate resources to help participant work on and attain dietary goals.;Weight Management/Obesity: Establish reasonable short term and long term weight goals.    Admit Weight 260 lb 8 oz (118.2 kg)    Goal Weight: Short Term 255 lb (115.7 kg)    Goal Weight: Long Term 225 lb (102.1 kg)    Expected Outcomes Long Term: Adherence to nutrition and physical activity/exercise program aimed toward attainment of established weight goal;Short Term: Continue to assess and modify interventions until short term weight is achieved;Weight Loss: Understanding of general recommendations for a balanced deficit meal plan, which promotes 1-2 lb weight loss per week and includes a negative energy balance of [phone] kcal/d;Understanding recommendations for meals to include 15-35% energy as protein, 25-35% energy from fat, 35-60% energy from carbohydrates, less than $RemoveB'200mg'jShOrDwj$  of dietary cholesterol, 20-35 gm of total fiber daily;Understanding of distribution of calorie intake throughout the day with the consumption of 4-5 meals/snacks    Diabetes Yes    Intervention Provide education about signs/symptoms and action to take for hypo/hyperglycemia.;Provide education about proper nutrition, including hydration, and aerobic/resistive exercise prescription along with prescribed medications to achieve blood glucose in normal ranges: Fasting glucose 65-99 mg/dL    Expected Outcomes Short Term: Participant verbalizes understanding of the signs/symptoms and immediate care of hyper/hypoglycemia, proper foot care and importance of medication, aerobic/resistive exercise and nutrition plan for blood glucose control.;Long Term:  Attainment of HbA1C < 7%.    Hypertension Yes    Intervention Provide education on lifestyle modifcations including regular physical activity/exercise, weight management, moderate sodium restriction and increased consumption of fresh fruit, vegetables, and low fat dairy, alcohol moderation, and smoking cessation.;Monitor prescription use compliance.    Expected Outcomes Short Term: Continued assessment and intervention until BP is < 140/87mm HG in hypertensive participants. < 130/65mm HG in hypertensive participants with diabetes, heart failure or chronic kidney disease.;Long Term: Maintenance of blood pressure at goal levels.    Lipids Yes    Intervention Provide education and support for participant on nutrition & aerobic/resistive exercise along with prescribed medications to achieve LDL '70mg'$ , HDL >$Remo'40mg'kegJW$ .    Expected Outcomes Short Term: Participant states understanding of desired cholesterol values and is compliant with medications prescribed. Participant is following exercise prescription and nutrition guidelines.;Long Term: Cholesterol controlled with medications as prescribed, with individualized exercise RX and with personalized nutrition plan. Value goals: LDL < $Rem'70mg'WyGl$ , HDL > 40 mg.           Education:Diabetes - Individual verbal and written instruction to review signs/symptoms of diabetes, desired ranges of glucose level fasting, after meals and with exercise. Acknowledge  that pre and post exercise glucose checks will be done for 3 sessions at entry of program. [place] from 01/19/2021 in [facility] Cardiac and Pulmonary Rehab  Date 10/18/20  Educator AS  Instruction Review Code 1- Verbalizes Understanding      Core Components/Risk Factors/Patient Goals Review:   Goals and Risk Factor Review    Row Name 11/10/20 0809 12/08/20 4827 01/14/21 0758         Core Components/Risk Factors/Patient Goals Review   Personal Goals Review Weight  Management/Obesity;Lipids;Diabetes;Hypertension Weight Management/Obesity;Lipids;Diabetes;Hypertension Weight Management/Obesity;Diabetes     Review Ronalee Belts is taking all medications as directed.  He does check BP and BG at home.  Today FBG was 126.  He has met with RD and is watching portion sizes.  He will have blood work to recheck cholesterol etc in a couple months. He reports he has lost over 10 pounds in the hospital - likely muscle tissue - his weight has been stable while he has been at rehab. Ronalee Belts is taking all medications as directed. He checks BP and BG at home; BG ~150, BP 140/80.  He will have blood work to recheck cholesterol etc in a couple months. He continues to work on his diet and attend rehab. Ronalee Belts has been checking his sugar at home routinely. He is trying to keep his sugar below 130 fasting. When his sugar is higher its because he eats later. He is good at eating earlier in the day and has to eat small meals throughout the day.     Expected Outcomes Short:  continue to take meds as directed and monitor risk factors Long:  keep risk factors managed Short:  continue to take meds as directed and monitor risk factors Long:  keep risk factors managed Short: continue to check sugar and eat small meals. Long: get fasting BG below 130.            Core Components/Risk Factors/Patient Goals at Discharge (Final Review):   Goals and Risk Factor Review - 01/14/21 0758      Core Components/Risk Factors/Patient Goals Review   Personal Goals Review Weight Management/Obesity;Diabetes    Review Ronalee Belts has been checking his sugar at home routinely. He is trying to keep his sugar below 130 fasting. When his sugar is higher its because he eats later. He is good at eating earlier in the day and has to eat small meals throughout the day.    Expected Outcomes Short: continue to check sugar and eat small meals. Long: get fasting BG below 130.           ITP Comments:  ITP Comments    Row Name 10/13/20  1540 10/18/20 1646 11/01/20 0836 11/03/20 0957 11/08/20 1114   ITP Comments Virtual Visit completed. Patient informed on EP and RD appointment and 6 Minute walk test. Patient also informed of patient health questionnaires on My Chart. Patient Verbalizes understanding. Visit diagnosis can be found in [facility]12/22/2021. Completed 6MWT and gym orientation. Initial ITP created and sent for review to Dr. Emily Filbert, Medical Director. First full day of exercise!  Patient was oriented to gym and equipment including functions, settings, policies, and procedures.  Patient's individual exercise prescription and treatment plan were reviewed.  All starting workloads were established based on the results of the 6 minute walk test done at initial orientation visit.  The plan for exercise progression was also introduced and progression will be customized based on patient's performance and goals. 30 Day review completed. Medical Director  ITP review done, changes made as directed, and signed approval by Medical Director.  New to program Completed initial RD evaluation   Row Name 11/23/20 1537 12/01/20 0713 12/02/20 1309 12/08/20 0808 12/22/20 1031   ITP Comments Pt called out sick last week. Currently admitted to [facility] on 2/11 with pancreatitis. On going fever >10 days not responding to antibiotics 30 Day review completed. Medical Director ITP review done, changes made as directed, and signed approval by Medical Director.  REmains out for medical reasons Called to check in on pt.  He is home and seen in f/u.  Out for week.  He is scheduled to return on Wed 3/2. Patient returned today and provided letter from MD illustrating approval and safety to continue with rehab. Pt called out today with a fever again.   [place] Name 12/29/20 1127 01/24/21 0841 01/26/21 0639       ITP Comments 30 Day review completed. Medical Director ITP review done, changes made as directed, and signed approval by Medical Director. Blood sugar started dropping.  Had juice with resolution of symptoms and then ate a pack of cra 30 Day review completed. Medical Director ITP review done, changes made as directed, and signed approval by Medical Director.            Comments:

## 2021-01-28 ENCOUNTER — Encounter: Payer: Medicare Other | Admitting: *Deleted

## 2021-01-28 ENCOUNTER — Other Ambulatory Visit: Payer: Self-pay

## 2021-01-28 DIAGNOSIS — Z9861 Coronary angioplasty status: Secondary | ICD-10-CM

## 2021-01-28 NOTE — Progress Notes (Signed)
Daily Session Note  Patient Details  Name: Derek Dickerson MRN: [id number] Date of Birth: 04-29-1961 Referring Provider:   Flowsheet Row Cardiac Rehab from 10/18/2020 in [facility] Cardiac and Pulmonary Rehab  Referring Provider [name]      Encounter Date: 01/28/2021  Check In:  Session Check In - 01/28/21 0847      Check-In   Supervising physician immediately available to respond to emergencies See telemetry face sheet for immediately available ER MD    Location [facility]-Cardiac & Pulmonary Rehab    Staff Present Heath Lark, RN, BSN, Jacklynn Bue, MS Exercise Physiologist;Joseph Tessie Fass RCP,RRT,BSRT    Virtual Visit No    Medication changes reported     No    Fall or balance concerns reported    No    Warm-up and Cool-down Performed on first and last piece of equipment    Resistance Training Performed Yes    VAD Patient? No    PAD/SET Patient? No      Pain Assessment   Currently in Pain? No/denies              Social History   Tobacco Use  Smoking Status Former Smoker  . Packs/day: 1.00  . Years: 40.00  . Pack years: 40.00  . Types: Cigarettes  . Quit date: 2013  . Years since quitting: 9.3  Smokeless Tobacco Never Used    Goals Met:  Independence with exercise equipment Exercise tolerated well No report of cardiac concerns or symptoms  Goals Unmet:  Not Applicable  Comments: Pt able to follow exercise prescription today without complaint.  Will continue to monitor for progression.    Dr. Emily Filbert is Medical Director for [place] and [facility].

## 2021-02-02 ENCOUNTER — Other Ambulatory Visit: Payer: Self-pay

## 2021-02-02 DIAGNOSIS — Z9861 Coronary angioplasty status: Secondary | ICD-10-CM | POA: Diagnosis not present

## 2021-02-02 NOTE — Progress Notes (Signed)
Daily Session Note  Patient Details  Name: Derek Dickerson MRN: [id number] Date of Birth: July 07, 1961 Referring Provider:   Flowsheet Row Cardiac Rehab from 10/18/2020 in [facility] Cardiac and Pulmonary Rehab  Referring Provider [name]      Encounter Date: 02/02/2021  Check In:  Session Check In - 02/02/21 0745      Check-In   Supervising physician immediately available to respond to emergencies See telemetry face sheet for immediately available ER MD    Location [facility]-Cardiac & Pulmonary Rehab    Staff Present Birdie Sons, MPA, Elveria Rising, BA, ACSM CEP, Exercise Physiologist;Joseph Tessie Fass RCP,RRT,BSRT    Virtual Visit No    Medication changes reported     No    Fall or balance concerns reported    No    Warm-up and Cool-down Performed on first and last piece of equipment    Resistance Training Performed Yes    VAD Patient? No    PAD/SET Patient? No      Pain Assessment   Currently in Pain? No/denies              Social History   Tobacco Use  Smoking Status Former Smoker  . Packs/day: 1.00  . Years: 40.00  . Pack years: 40.00  . Types: Cigarettes  . Quit date: 2013  . Years since quitting: 9.3  Smokeless Tobacco Never Used    Goals Met:  Independence with exercise equipment Exercise tolerated well No report of cardiac concerns or symptoms Strength training completed today  Goals Unmet:  Not Applicable  Comments: Pt able to follow exercise prescription today without complaint.  Will continue to monitor for progression.    Dr. Emily Filbert is Medical Director for [place] and [facility].

## 2021-02-04 ENCOUNTER — Other Ambulatory Visit: Payer: Self-pay

## 2021-02-04 DIAGNOSIS — Z9861 Coronary angioplasty status: Secondary | ICD-10-CM | POA: Diagnosis not present

## 2021-02-04 NOTE — Progress Notes (Signed)
Daily Session Note  Patient Details  Name: Derek Dickerson MRN: [id number] Date of Birth: 06-17-1961 Referring Provider:   Flowsheet Row Cardiac Rehab from 10/18/2020 in [facility] Cardiac and Pulmonary Rehab  Referring Provider [name]      Encounter Date: 02/04/2021  Check In:  Session Check In - 02/04/21 0830      Check-In   Supervising physician immediately available to respond to emergencies See telemetry face sheet for immediately available ER MD    Location [facility]-Cardiac & Pulmonary Rehab    Staff Present Basilia Jumbo, RN, BSN;Jessica [place], MA, RCEP, CCRP, CCET;Joseph Axtell RCP,RRT,BSRT    Medication changes reported     No    Fall or balance concerns reported    No    Tobacco Cessation No Change    Warm-up and Cool-down Performed on first and last piece of equipment    Resistance Training Performed Yes    VAD Patient? No    PAD/SET Patient? No      Pain Assessment   Currently in Pain? No/denies              Social History   Tobacco Use  Smoking Status Former Smoker  . Packs/day: 1.00  . Years: 40.00  . Pack years: 40.00  . Types: Cigarettes  . Quit date: 2013  . Years since quitting: 9.3  Smokeless Tobacco Never Used    Goals Met:  Independence with exercise equipment Exercise tolerated well No report of cardiac concerns or symptoms  Goals Unmet:  Not Applicable  Comments: Pt able to follow exercise prescription today without complaint.  Will continue to monitor for progression.    Dr. Emily Filbert is Medical Director for [place] and [facility].

## 2021-02-07 ENCOUNTER — Other Ambulatory Visit: Payer: Self-pay

## 2021-02-07 ENCOUNTER — Encounter: Payer: Medicare Other | Attending: Cardiology

## 2021-02-07 DIAGNOSIS — Z9861 Coronary angioplasty status: Secondary | ICD-10-CM | POA: Insufficient documentation

## 2021-02-07 NOTE — Progress Notes (Signed)
Daily Session Note  Patient Details  Name: Derek Dickerson MRN: [id number] Date of Birth: 02-12-1961 Referring Provider:   Flowsheet Row Cardiac Rehab from 10/18/2020 in [facility] Cardiac and Pulmonary Rehab  Referring Provider [name]      Encounter Date: 02/07/2021  Check In:  Session Check In - 02/07/21 0801      Check-In   Supervising physician immediately available to respond to emergencies See telemetry face sheet for immediately available ER MD    Location [facility]-Cardiac & Pulmonary Rehab    Staff Present Alberteen Sam, MA, RCEP, CCRP, CCET;Zander Ingham RN, [place], BS, ACSM CEP, Exercise Physiologist;Joseph Armstrong [employer] [place]    Virtual Visit No    Medication changes reported     No    Fall or balance concerns reported    No    Tobacco Cessation No Change    Warm-up and Cool-down Performed on first and last piece of equipment    Resistance Training Performed Yes    VAD Patient? No    PAD/SET Patient? No      Pain Assessment   Currently in Pain? No/denies              Social History   Tobacco Use  Smoking Status Former Smoker  . Packs/day: 1.00  . Years: 40.00  . Pack years: 40.00  . Types: Cigarettes  . Quit date: 2013  . Years since quitting: 9.3  Smokeless Tobacco Never Used    Goals Met:  Proper associated with RPD/PD & O2 Sat Independence with exercise equipment Exercise tolerated well No report of cardiac concerns or symptoms Strength training completed today  Goals Unmet:  Not Applicable  Comments: Pt able to follow exercise prescription today without complaint.  Will continue to monitor for progression.   Dr. Emily Filbert is Medical Director for [place] and [facility].

## 2021-02-11 ENCOUNTER — Encounter: Payer: Medicare Other | Admitting: *Deleted

## 2021-02-11 ENCOUNTER — Other Ambulatory Visit: Payer: Self-pay

## 2021-02-11 DIAGNOSIS — Z9861 Coronary angioplasty status: Secondary | ICD-10-CM

## 2021-02-11 NOTE — Progress Notes (Signed)
Daily Session Note  Patient Details  Name: Derek Dickerson MRN: [id number] Date of Birth: 1961/04/13 Referring Provider:   Flowsheet Row Cardiac Rehab from 10/18/2020 in [facility] Cardiac and Pulmonary Rehab  Referring Provider [name]      Encounter Date: 02/11/2021  Check In:  Session Check In - 02/11/21 0839      Check-In   Supervising physician immediately available to respond to emergencies See telemetry face sheet for immediately available ER MD    Location [facility]-Cardiac & Pulmonary Rehab    Staff Present Heath Lark, RN, BSN, CCRP;Jessica [place], MA, RCEP, CCRP, CCET;Joseph Sunfield RCP,RRT,BSRT    Virtual Visit No    Medication changes reported     No    Fall or balance concerns reported    No    Warm-up and Cool-down Performed on first and last piece of equipment    Resistance Training Performed Yes    VAD Patient? No    PAD/SET Patient? No      Pain Assessment   Currently in Pain? No/denies              Social History   Tobacco Use  Smoking Status Former Smoker  . Packs/day: 1.00  . Years: 40.00  . Pack years: 40.00  . Types: Cigarettes  . Quit date: 2013  . Years since quitting: 9.3  Smokeless Tobacco Never Used    Goals Met:  Independence with exercise equipment Exercise tolerated well No report of cardiac concerns or symptoms  Goals Unmet:  Not Applicable  Comments: Pt able to follow exercise prescription today without complaint.  Will continue to monitor for progression.    Dr. Emily Filbert is Medical Director for [place] and [facility].

## 2021-02-14 ENCOUNTER — Encounter: Payer: Medicare Other | Admitting: *Deleted

## 2021-02-14 DIAGNOSIS — Z9861 Coronary angioplasty status: Secondary | ICD-10-CM

## 2021-02-14 NOTE — Progress Notes (Signed)
Daily Session Note  Patient Details  Name: Aking Klabunde MRN: [id number] Date of Birth: 07-18-1961 Referring Provider:   Flowsheet Row Cardiac Rehab from 10/18/2020 in [facility] Cardiac and Pulmonary Rehab  Referring Provider [name]      Encounter Date: 02/14/2021  Check In:  Session Check In - 02/14/21 0926      Check-In   Supervising physician immediately available to respond to emergencies See telemetry face sheet for immediately available ER MD    Location [facility]-Cardiac & Pulmonary Rehab    Staff Present Earlean Shawl, BS, ACSM CEP, Exercise Physiologist;Joseph Flavia Shipper;Heath Lark, RN, BSN, CCRP    Virtual Visit No    Medication changes reported     No    Fall or balance concerns reported    No    Warm-up and Cool-down Performed on first and last piece of equipment    Resistance Training Performed Yes    VAD Patient? No    PAD/SET Patient? No      Pain Assessment   Currently in Pain? No/denies              Social History   Tobacco Use  Smoking Status Former Smoker  . Packs/day: 1.00  . Years: 40.00  . Pack years: 40.00  . Types: Cigarettes  . Quit date: 2013  . Years since quitting: 9.3  Smokeless Tobacco Never Used    Goals Met:  Independence with exercise equipment Exercise tolerated well No report of cardiac concerns or symptoms  Goals Unmet:  Not Applicable  Comments: Pt able to follow exercise prescription today without complaint.  Will continue to monitor for progression.    Dr. Emily Filbert is Medical Director for [place] and [facility].

## 2021-02-18 ENCOUNTER — Encounter: Payer: Medicare Other | Admitting: *Deleted

## 2021-02-18 ENCOUNTER — Other Ambulatory Visit: Payer: Self-pay

## 2021-02-18 DIAGNOSIS — Z9861 Coronary angioplasty status: Secondary | ICD-10-CM | POA: Diagnosis not present

## 2021-02-18 NOTE — Progress Notes (Signed)
Daily Session Note  Patient Details  Name: Derek Dickerson MRN: [id number] Date of Birth: May 08, 1961 Referring Provider:   Flowsheet Row Cardiac Rehab from 10/18/2020 in [facility] Cardiac and Pulmonary Rehab  Referring Provider [name]      Encounter Date: 02/18/2021  Check In:  Session Check In - 02/18/21 0839      Check-In   Supervising physician immediately available to respond to emergencies See telemetry face sheet for immediately available ER MD    Location [facility]-Cardiac & Pulmonary Rehab    Staff Present Heath Lark, RN, BSN, CCRP;Jessica [place], MA, RCEP, CCRP, CCET;Melissa Kelly RDN, LDN    Virtual Visit No    Medication changes reported     No    Fall or balance concerns reported    No    Warm-up and Cool-down Performed on first and last piece of equipment    Resistance Training Performed Yes    VAD Patient? No    PAD/SET Patient? No      Pain Assessment   Currently in Pain? No/denies              Social History   Tobacco Use  Smoking Status Former Smoker  . Packs/day: 1.00  . Years: 40.00  . Pack years: 40.00  . Types: Cigarettes  . Quit date: 2013  . Years since quitting: 9.3  Smokeless Tobacco Never Used    Goals Met:  Independence with exercise equipment Exercise tolerated well No report of cardiac concerns or symptoms  Goals Unmet:  Not Applicable  Comments: Pt able to follow exercise prescription today without complaint.  Will continue to monitor for progression.    Dr. Emily Filbert is Medical Director for [place] and [facility].

## 2021-02-21 ENCOUNTER — Other Ambulatory Visit: Payer: Self-pay

## 2021-02-21 ENCOUNTER — Encounter: Payer: Medicare Other | Admitting: *Deleted

## 2021-02-21 DIAGNOSIS — Z9861 Coronary angioplasty status: Secondary | ICD-10-CM

## 2021-02-21 NOTE — Progress Notes (Signed)
Daily Session Note  Patient Details  Name: Derek Dickerson MRN: [id number] Date of Birth: 10-Feb-1961 Referring Provider:   Flowsheet Row Cardiac Rehab from 10/18/2020 in [facility] Cardiac and Pulmonary Rehab  Referring Provider [name]      Encounter Date: 02/21/2021  Check In:  Session Check In - 02/21/21 0842      Check-In   Supervising physician immediately available to respond to emergencies See telemetry face sheet for immediately available ER MD    Location [facility]-Cardiac & Pulmonary Rehab    Staff Present Heath Lark, RN, BSN, CCRP;Joseph Hood RCP,RRT,BSRT;Kelly [place], [place], ACSM CEP, Exercise Physiologist    Virtual Visit No    Medication changes reported     No    Fall or balance concerns reported    No    Warm-up and Cool-down Performed on first and last piece of equipment    Resistance Training Performed Yes    VAD Patient? No    PAD/SET Patient? No      Pain Assessment   Currently in Pain? No/denies              Social History   Tobacco Use  Smoking Status Former Smoker  . Packs/day: 1.00  . Years: 40.00  . Pack years: 40.00  . Types: Cigarettes  . Quit date: 2013  . Years since quitting: 9.3  Smokeless Tobacco Never Used    Goals Met:  Independence with exercise equipment Exercise tolerated well No report of cardiac concerns or symptoms  Goals Unmet:  Not Applicable  Comments: Pt able to follow exercise prescription today without complaint.  Will continue to monitor for progression.    Dr. Emily Filbert is Medical Director for [place] and [facility].

## 2021-02-23 ENCOUNTER — Encounter: Payer: Self-pay | Admitting: *Deleted

## 2021-02-23 ENCOUNTER — Other Ambulatory Visit: Payer: Self-pay

## 2021-02-23 DIAGNOSIS — Z9861 Coronary angioplasty status: Secondary | ICD-10-CM | POA: Diagnosis not present

## 2021-02-23 NOTE — Progress Notes (Signed)
Daily Session Note  Patient Details  Name: Derek Dickerson MRN: [id number] Date of Birth: 07/07/1961 Referring Provider:   Flowsheet Row Cardiac Rehab from 10/18/2020 in [facility] Cardiac and Pulmonary Rehab  Referring Provider [name]      Encounter Date: 02/23/2021  Check In:  Session Check In - 02/23/21 0756      Check-In   Supervising physician immediately available to respond to emergencies See telemetry face sheet for immediately available ER MD    Location [facility]-Cardiac & Pulmonary Rehab    Staff Present Birdie Sons, MPA, Elveria Rising, BA, ACSM CEP, Exercise Physiologist;Joseph Tessie Fass RCP,RRT,BSRT    Virtual Visit No    Medication changes reported     No    Fall or balance concerns reported    No    Tobacco Cessation No Change    Warm-up and Cool-down Performed on first and last piece of equipment    Resistance Training Performed Yes    VAD Patient? No    PAD/SET Patient? No      Pain Assessment   Currently in Pain? No/denies              Social History   Tobacco Use  Smoking Status Former Smoker  . Packs/day: 1.00  . Years: 40.00  . Pack years: 40.00  . Types: Cigarettes  . Quit date: 2013  . Years since quitting: 9.3  Smokeless Tobacco Never Used    Goals Met:  Independence with exercise equipment Exercise tolerated well No report of cardiac concerns or symptoms Strength training completed today  Goals Unmet:  Not Applicable  Comments: Pt able to follow exercise prescription today without complaint.  Will continue to monitor for progression.    Dr. Emily Filbert is Medical Director for [place] and [facility].

## 2021-02-23 NOTE — Progress Notes (Signed)
Cardiac Individual Treatment Plan  Patient Details  Name: Derek Dickerson MRN: [id number] Date of Birth: [birth year] Referring Provider:   Flowsheet Row Cardiac Rehab from 10/18/2020 in [facility] Cardiac and Pulmonary Rehab  Referring Provider [name]      Initial Encounter Date:  Flowsheet Row Cardiac Rehab from 10/18/2020 in [facility] Cardiac and Pulmonary Rehab  Date 10/18/20      Visit Diagnosis: S/P PTCA (percutaneous transluminal coronary angioplasty)  Patient's Home Medications on Admission:  Current Outpatient Medications:  .  aspirin 81 MG EC tablet, Take by mouth., Disp: , Rfl:  .  Blood Glucose Monitoring Suppl (FIFTY50 GLUCOSE METER 2.0) w/Device KIT, Tests 8 times a day, One touch Ultra 2 glucometer, E08.9, Disp: , Rfl:  .  buPROPion (WELLBUTRIN XL) 300 MG 24 hr tablet, Take by mouth., Disp: , Rfl:  .  cetirizine-pseudoephedrine (ZYRTEC-D) 5-120 MG tablet, Take by mouth., Disp: , Rfl:  .  Cholecalciferol 25 MCG (1000 UT) tablet, Take by mouth., Disp: , Rfl:  .  clopidogrel (PLAVIX) 75 MG tablet, Take by mouth., Disp: , Rfl:  .  EPINEPHrine 0.3 mg/0.3 mL IJ SOAJ injection, Inject into the muscle., Disp: , Rfl:  .  ferrous sulfate 325 (65 FE) MG tablet, Take by mouth., Disp: , Rfl:  .  gabapentin (NEURONTIN) 100 MG capsule, Take by mouth., Disp: , Rfl:  .  glucose blood (PRECISION QID TEST) test strip, Use one. 5 (five) to 8 (eight) times daily as needed. Use as instructed. ONE TOUCH ULTRA, Disp: , Rfl:  .  insulin glargine (LANTUS) 100 UNIT/ML Solostar Pen, Inject into the skin., Disp: , Rfl:  .  insulin lispro (HUMALOG) 100 UNIT/ML KwikPen, INJECT 20-30 UNITS WITH MEALS OR SNACKS UP TO FIVE TIMES DAILY (MAX OF 140 UNITS PER DAY), Disp: , Rfl:  .  Insulin Pen Needle (B-D UF III MINI PEN NEEDLES) 31G X 5 MM MISC, USE WITH INSULIN 7 TIMES DAILY, Disp: , Rfl:  .  omeprazole (PRILOSEC) 40 MG capsule, Take by mouth., Disp: , Rfl:  .  oxyCODONE (OXY IR/ROXICODONE) 5 MG immediate release  tablet, Take by mouth., Disp: , Rfl:  .  Pancrelipase, Lip-Prot-Amyl, (CREON) 24000-76000 units CPEP, Take by mouth., Disp: , Rfl:  .  rosuvastatin (CRESTOR) 20 MG tablet, Take by mouth., Disp: , Rfl:   Past Medical History: Past Medical History:  Diagnosis Date  . Diabetes mellitus without complication ([place])     Tobacco Use: Social History   Tobacco Use  Smoking Status Former Smoker  . Packs/day: 1.00  . Years: 40.00  . Pack years: 40.00  . Types: Cigarettes  . Quit date: 2013  . Years since quitting: 9.3  Smokeless Tobacco Never Used    Labs: Recent Review Flowsheet Data   There is no flowsheet data to display.      Exercise Target Goals: Exercise Program Goal: Individual exercise prescription set using results from initial 6 min walk test and THRR while considering  patient's activity barriers and safety.   Exercise Prescription Goal: Initial exercise prescription builds to 30-45 minutes a day of aerobic activity, 2-3 days per week.  Home exercise guidelines will be given to patient during program as part of exercise prescription that the participant will acknowledge.   Education: Aerobic Exercise: - Group verbal and visual presentation on the components of exercise prescription. Introduces F.I.T.T principle from ACSM for exercise prescriptions.  Reviews F.I.T.T. principles of aerobic exercise including progression. Written material given at graduation.   Education: Resistance Exercise: -  Group verbal and visual presentation on the components of exercise prescription. Introduces F.I.T.T principle from ACSM for exercise prescriptions  Reviews F.I.T.T. principles of resistance exercise including progression. Written material given at graduation. Flowsheet Row Cardiac Rehab from 02/23/2021 in [facility] Cardiac and Pulmonary Rehab  Date 02/02/21  Educator AS  Instruction Review Code 1- Verbalizes Understanding       Education: Exercise & Equipment Safety: - Individual  verbal instruction and demonstration of equipment use and safety with use of the equipment. Flowsheet Row Cardiac Rehab from 02/23/2021 in [facility] Cardiac and Pulmonary Rehab  Date 10/18/20  Educator AS  Instruction Review Code 1- Verbalizes Understanding      Education: Exercise Physiology & General Exercise Guidelines: - Group verbal and written instruction with models to review the exercise physiology of the cardiovascular system and associated critical values. Provides general exercise guidelines with specific guidelines to those with heart or lung disease.  Flowsheet Row Cardiac Rehab from 02/23/2021 in [facility] Cardiac and Pulmonary Rehab  Date 01/19/21  Educator [facility]  Instruction Review Code 1- Verbalizes Understanding      Education: Flexibility, Balance, Mind/Body Relaxation: - Group verbal and visual presentation with interactive activity on the components of exercise prescription. Introduces F.I.T.T principle from ACSM for exercise prescriptions. Reviews F.I.T.T. principles of flexibility and balance exercise training including progression. Also discusses the mind body connection.  Reviews various relaxation techniques to help reduce and manage stress (i.e. Deep breathing, progressive muscle relaxation, and visualization). Balance handout provided to take home. Written material given at graduation.   Activity Barriers & Risk Stratification:   6 Minute Walk:  6 Minute Walk    Row Name 10/18/20 1634         6 Minute Walk   Phase Initial     Distance 1400 feet     Walk Time 6 minutes     # of Rest Breaks 0     MPH 2.65     METS 3.3     RPE 12     Perceived Dyspnea  1     VO2 Peak 11.5     Symptoms No     Resting HR 75 bpm     Resting BP 136/72     Resting Oxygen Saturation  99 %     Exercise Oxygen Saturation  during 6 min walk 98 %     Max Ex. HR 103 bpm     Max Ex. BP 160/76     2 Minute Post BP 134/60            Oxygen Initial Assessment:   Oxygen  Re-Evaluation:   Oxygen Discharge (Final Oxygen Re-Evaluation):   Initial Exercise Prescription:  Initial Exercise Prescription - 10/18/20 1600      Date of Initial Exercise RX and Referring Provider   Date 10/18/20    Referring Provider [name]      Treadmill   MPH 2.5    Grade 0.5    Minutes 15    METs 3.3      REL-XR   Level 3    Speed 50    Minutes 15    METs 3.3      T5 Nustep   Level 2    SPM 80    Minutes 15    METs 3.3      Prescription Details   Frequency (times per week) 3    Duration Progress to 30 minutes of continuous aerobic without signs/symptoms of physical distress  Intensity   THRR 40-80% of Max Heartrate 109-142    Ratings of Perceived Exertion 11-15    Perceived Dyspnea 0-4      Resistance Training   Training Prescription Yes    Weight 4 lb    Reps 10-15           Perform Capillary Blood Glucose checks as needed.  Exercise Prescription Changes:  Exercise Prescription Changes    Row Name 10/18/20 1600 11/08/20 1600 11/10/20 0800 11/23/20 1500 12/08/20 1600     Response to Exercise   Blood Pressure (Admit) 136/72 130/82 -- 136/56 130/60   Blood Pressure (Exercise) 160/76 140/78 -- 144/70 158/58   Blood Pressure (Exit) 134/60 142/86 -- 122/60 132/74   Heart Rate (Admit) 75 bpm 76 bpm -- 75 bpm 86 bpm   Heart Rate (Exercise) 103 bpm 96 bpm -- 97 bpm 111 bpm   Heart Rate (Exit) 89 bpm 72 bpm -- 73 bpm 75 bpm   Oxygen Saturation (Admit) 99 % -- -- -- --   Oxygen Saturation (Exercise) 98 % -- -- -- --   Rating of Perceived Exertion (Exercise) 12 13 -- 14 13   Perceived Dyspnea (Exercise) 1 -- -- -- --   Symptoms none none -- none none   Comments -- -- -- from visit on 11/12/20 --   Duration -- Continue with 30 min of aerobic exercise without signs/symptoms of physical distress. -- Continue with 30 min of aerobic exercise without signs/symptoms of physical distress. Continue with 30 min of aerobic exercise without signs/symptoms of  physical distress.   Intensity -- THRR unchanged -- THRR unchanged THRR unchanged     Progression   Progression -- Continue to progress workloads to maintain intensity without signs/symptoms of physical distress. -- Continue to progress workloads to maintain intensity without signs/symptoms of physical distress. Continue to progress workloads to maintain intensity without signs/symptoms of physical distress.   Average METs -- 2.53 -- 2.63 3.5     Resistance Training   Training Prescription -- Yes -- Yes Yes   Weight -- 5 lb -- 5 lb 5 lb   Reps -- 10-15 -- 10-15 10-15     Interval Training   Interval Training -- No -- No --     Treadmill   MPH -- 2.5 -- 2.6 3   Grade -- 0.5 -- 0.5 0.5   Minutes -- 15 -- 15 15   METs -- 3.09 -- 3.17 3.5     REL-XR   Level -- 3 -- -- --   Minutes -- 15 -- -- --   METs -- 2.3 -- -- --     T5 Nustep   Level -- 2 -- 3 --   Minutes -- 15 -- 15 --   METs -- 2.2 -- 2.1 --     Home Exercise Plan   Plans to continue exercise at -- -- [facility]   Frequency -- -- Add 2 additional days to program exercise sessions. Add 2 additional days to program exercise sessions. Add 2 additional days to program exercise sessions.   Initial Home Exercises Provided -- -- 11/10/20 11/10/20 11/10/20   Row Name 12/22/20 1000 01/04/21 1300 01/17/21 1400 02/01/21 1200 02/15/21 1400     Response to Exercise   Blood Pressure (Admit) 122/70 128/68 124/62 124/60 112/82   Blood Pressure (Exercise) 148/82 142/62 162/62 142/64 144/64   Blood Pressure (Exit) 128/70 138/64 124/60 134/68 118/62   Heart  Rate (Admit) 90 bpm 69 bpm 88 bpm 74 bpm 69 bpm   Heart Rate (Exercise) 111 bpm 107 bpm 122 bpm 108 bpm 100 bpm   Heart Rate (Exit) 88 bpm 83 bpm 103 bpm 84 bpm 98 bpm   Oxygen Saturation (Exercise) -- -- -- -- 96 %   Oxygen Saturation (Exit) -- -- -- -- 98 %   Rating of Perceived Exertion (Exercise) _0 Symptoms none  none none none none   Duration Continue with 30 min of aerobic exercise without signs/symptoms of physical distress. Continue with 30 min of aerobic exercise without signs/symptoms of physical distress. Continue with 30 min of aerobic exercise without signs/symptoms of physical distress. Continue with 30 min of aerobic exercise without signs/symptoms of physical distress. Continue with 30 min of aerobic exercise without signs/symptoms of physical distress.   Intensity _1      Progression   Progression Continue to progress workloads to maintain intensity without signs/symptoms of physical distress. Continue to progress workloads to maintain intensity without signs/symptoms of physical distress. Continue to progress workloads to maintain intensity without signs/symptoms of physical distress. Continue to progress workloads to maintain intensity without signs/symptoms of physical distress. Continue to progress workloads to maintain intensity without signs/symptoms of physical distress.   Average METs 3.27 3.5 3.73 4.53 3.45     Resistance Training   Training Prescription _2    Weight 5 lb 5 lb 5 lb 5 lb 5 lb   Reps 10-15 10-15 10-15 10-15 10-15     Interval Training   Interval Training _3      Treadmill   MPH _4 3.5 2.4   Grade 0.5 0.5 0._5 Minutes _6 METs 3.5 3.5 3.5 4.16 3.17     Recumbant Elliptical   Level -- -- -- -- 1   Minutes -- -- -- -- 15   METs -- -- -- -- 2.5     REL-XR   Level 4 -- _7 Minutes 15 -- _8 METs 4 -- 5.3 4.9 4.7     T5 Nustep   Level _9 -- --   Minutes _10 -- --   METs 2.3 -- 2.4 -- --     Home Exercise Plan   Plans to continue exercise at [place] [facility] [place]   Frequency Add 2 additional days to program exercise sessions. Add 2 additional days to program  exercise sessions. Add 2 additional days to program exercise sessions. -- Add 2 additional days to program exercise sessions.   Initial Home Exercises Provided 11/10/20 11/10/20 11/10/20 -- 11/10/20          Exercise Comments:  Exercise Comments    Row Name 11/01/20 [phone] 12/08/20 0809 01/24/21 0841       Exercise Comments First full day of exercise!  Patient was oriented to gym and equipment including functions, settings, policies, and procedures.  Patient's individual exercise prescription and treatment plan were reviewed.  All starting workloads were established based on the results of the 6 minute walk test done at initial orientation visit.  The plan for exercise progression was also introduced and progression will be customized based on patient's performance and goals. Patient returned today and provided letter from  MD illustrating approval and safety to continue with rehab. Blood sugar started dropping. Had juice with resolution of symptoms and then ate a pack of cra            Exercise Goals and Review:  Exercise Goals    Row Name 10/18/20 1637             Exercise Goals   Increase Physical Activity Yes       Intervention Provide advice, education, support and counseling about physical activity/exercise needs.;Develop an individualized exercise prescription for aerobic and resistive training based on initial evaluation findings, risk stratification, comorbidities and participant's personal goals.       Expected Outcomes Short Term: Attend rehab on a regular basis to increase amount of physical activity.;Long Term: Add in home exercise to make exercise part of routine and to increase amount of physical activity.;Long Term: Exercising regularly at least 3-5 days a week.       Increase Strength and Stamina Yes       Intervention Provide advice, education, support and counseling about physical activity/exercise needs.       Expected Outcomes Short Term: Increase workloads from  initial exercise prescription for resistance, speed, and METs.;Short Term: Perform resistance training exercises routinely during rehab and add in resistance training at home;Long Term: Improve cardiorespiratory fitness, muscular endurance and strength as measured by increased METs and functional capacity (6MWT)       Able to understand and use rate of perceived exertion (RPE) scale Yes       Intervention Provide education and explanation on how to use RPE scale       Expected Outcomes Short Term: Able to use RPE daily in rehab to express subjective intensity level;Long Term:  Able to use RPE to guide intensity level when exercising independently       Able to understand and use Dyspnea scale Yes       Intervention Provide education and explanation on how to use Dyspnea scale       Expected Outcomes Short Term: Able to use Dyspnea scale daily in rehab to express subjective sense of shortness of breath during exertion;Long Term: Able to use Dyspnea scale to guide intensity level when exercising independently       Knowledge and understanding of Target Heart Rate Range (THRR) Yes       Intervention Provide education and explanation of THRR including how the numbers were predicted and where they are located for reference       Expected Outcomes Short Term: Able to state/look up THRR;Short Term: Able to use daily as guideline for intensity in rehab;Long Term: Able to use THRR to govern intensity when exercising independently       Able to check pulse independently Yes       Intervention Provide education and demonstration on how to check pulse in carotid and radial arteries.;Review the importance of being able to check your own pulse for safety during independent exercise       Expected Outcomes Short Term: Able to explain why pulse checking is important during independent exercise;Long Term: Able to check pulse independently and accurately       Understanding of Exercise Prescription Yes        Intervention Provide education, explanation, and written materials on patient's individual exercise prescription       Expected Outcomes Short Term: Able to explain program exercise prescription;Long Term: Able to explain home exercise prescription to exercise independently  Exercise Goals Re-Evaluation :  Exercise Goals Re-Evaluation    Row Name 11/01/20 [phone] 11/08/20 1629 11/10/20 0829 11/23/20 1543 12/08/20 0813     Exercise Goal Re-Evaluation   Exercise Goals Review Able to understand and use rate of perceived exertion (RPE) scale;Able to understand and use Dyspnea scale;Knowledge and understanding of Target Heart Rate Range (THRR);Understanding of Exercise Prescription Increase Physical Activity;Increase Strength and Stamina;Understanding of Exercise Prescription Increase Physical Activity;Increase Strength and Stamina;Able to understand and use rate of perceived exertion (RPE) scale;Knowledge and understanding of Target Heart Rate Range (THRR);Able to check pulse independently;Understanding of Exercise Prescription Increase Physical Activity;Increase Strength and Stamina;Understanding of Exercise Prescription Increase Physical Activity;Increase Strength and Stamina;Understanding of Exercise Prescription   Comments Reviewed RPE and dyspnea scales, THR and program prescription with pt today.  Pt voiced understanding and was given a copy of goals to take home. Derek Dickerson is off to a good start in rehab.  He has completed his first three full days of exercise already.  We will continue to monitor his progress. Reviewed home exercise with pt today.  Pt plans to walk and use stationary bike for exercise.  Reviewed THR, pulse, RPE, sign and symptoms, pulse oximetery and when to call 911 or MD.  Also discussed weather considerations and indoor options.  Pt voiced understanding. Only attended once since last review.  Currently in hospital He has a recumbant bike he uses (hasn't in the last week or  so). He was doing it 3-4x/week and 30 minutes (warm up and cool down included) RPE 13-15.   Expected Outcomes Short: Use RPE daily to regulate intensity. Long: Follow program prescription in THR. Short: Continue to attend rehab regularly  Long: Continue to follow program prescription. Short: add 2 days of exercise outside program sessions Long:  improve overall MET level Short: Clearance to return once discharged from hospital Long: Return to regular attendance ST: return to exercising at home using his recumbant bike LT: continue regular attendance at rehab, exercise at home when not at rehab regularly   [place] Name 12/22/20 1031 01/04/21 1304 01/17/21 1402 02/01/21 1244 02/04/21 0749     Exercise Goal Re-Evaluation   Exercise Goals Review Increase Physical Activity;Increase Strength and Stamina;Understanding of Exercise Prescription Increase Physical Activity;Increase Strength and Stamina Increase Physical Activity;Increase Strength and Stamina;Understanding of Exercise Prescription Increase Physical Activity;Increase Strength and Stamina Increase Physical Activity;Increase Strength and Stamina;Understanding of Exercise Prescription   Comments Derek Dickerson has been doing well in rehab.  He is up to level 4 on the XR.  He called out today with another fever.  We will continue to monitor his progress. Derek Dickerson has increased to level 5 on XR and is at 3 mph and .5 % on TM.  He does reach lower end of THR range.  We will continue to monitor progress. Derek Dickerson is doing well in rehab.  He is up to 5.3 METs on the XR.  We will continue to monitor his progress. Derek Dickerson has increased levels on TM and XR.  He reaches low end of THR range.  Staff will monitor progress. Derek Dickerson is doing well in rehab.  He is exercising at home.  He bought a NuStep for home and can watch TV while he exercising.  He is usually on there for about 40 min.  He is feeling pretty good about  it.   Expected Outcomes Short: Figure out fever Long: Continue to improve  stmaina. Short: attend consistently Long: build stamina overall Short: Increase workloads on T5 NuStep  Long: Continue to improve stamina Short: continue to progress workloads Long:  build overall stamina Short: Continue to exercise on off days Long: Continue to improve stamina   Row Name 02/15/21 1434             Exercise Goal Re-Evaluation   Exercise Goals Review Increase Physical Activity;Increase Strength and Stamina;Understanding of Exercise Prescription       Comments Derek Dickerson is doing well in rehab.  He is up to level 8 on the XR.  We will continue to monitor his progress.       Expected Outcomes Short: Add more to treadmill Long: Continue to improve stamina              Discharge Exercise Prescription (Final Exercise Prescription Changes):  Exercise Prescription Changes - 02/15/21 1400      Response to Exercise   Blood Pressure (Admit) 112/82    Blood Pressure (Exercise) 144/64    Blood Pressure (Exit) 118/62    Heart Rate (Admit) 69 bpm    Heart Rate (Exercise) 100 bpm    Heart Rate (Exit) 98 bpm    Oxygen Saturation (Exercise) 96 %    Oxygen Saturation (Exit) 98 %    Rating of Perceived Exertion (Exercise) 14    Symptoms none    Duration Continue with 30 min of aerobic exercise without signs/symptoms of physical distress.    Intensity THRR unchanged      Progression   Progression Continue to progress workloads to maintain intensity without signs/symptoms of physical distress.    Average METs 3.45      Resistance Training   Training Prescription Yes    Weight 5 lb    Reps 10-15      Interval Training   Interval Training No      Treadmill   MPH 2.4    Grade 1    Minutes 15    METs 3.17      Recumbant Elliptical   Level 1    Minutes 15    METs 2.5      REL-XR   Level 8    Minutes 15    METs 4.7      Home Exercise Plan   Plans to continue exercise at [facility]    Frequency Add 2 additional days to program exercise sessions.    Initial Home  Exercises Provided 11/10/20           Nutrition:  Target Goals: Understanding of nutrition guidelines, daily intake of sodium <1591m, cholesterol <2071m calories 30% from fat and 7% or less from saturated fats, daily to have 5 or more servings of fruits and vegetables.  Education: All About Nutrition: -Group instruction provided by verbal, written material, interactive activities, discussions, models, and posters to present general guidelines for heart healthy nutrition including fat, fiber, MyPlate, the role of sodium in heart healthy nutrition, utilization of the nutrition label, and utilization of this knowledge for meal planning. Follow up email sent as well. Written material given at graduation.   Biometrics:  Pre Biometrics - 10/18/20 1637      Pre Biometrics   Height 5' 10.75" (1.797 m)    Weight 260 lb 8 oz (118.2 kg)    BMI (Calculated) 36.59            Nutrition Therapy Plan and Nutrition Goals:  Nutrition Therapy & Goals - 11/08/20 1047      Nutrition Therapy   Diet Heart healthy, low Na, diabetes friendly  Drug/Food Interactions Statins/Certain Fruits    Protein (specify units) 95g    Fiber 30 grams    Whole Grain Foods 3 servings    Saturated Fats 12 max. grams    Fruits and Vegetables 8 servings/day    Sodium 1.5 grams      Personal Nutrition Goals   Nutrition Goal ST: prepare lunches, prepare dinner 3x/week LT: cook/prepare most meals at home    Comments A1C 8 (January 2022). Insulin - short acting and long lasting. When his BG gets too low he uses orange juice. He feels he eats too much fatty foods. B: 3 tbsp - vanilla yogurt with 1/2 cup granola L: frozen meals D: fast food, frozen foods S: potato chips Drinks: diet soda and sweet tea (splenda). Discussed heart healthy eating and diabetes friendly eating. He feels he does not have a good diet and would like to start preparing his own meals as he feels that was the barrier for him to eat healthier.       Intervention Plan   Intervention Prescribe, educate and counsel regarding individualized specific dietary modifications aiming towards targeted core components such as weight, hypertension, lipid management, diabetes, heart failure and other comorbidities.;Nutrition handout(s) given to patient.    Expected Outcomes Short Term Goal: Understand basic principles of dietary content, such as calories, fat, sodium, cholesterol and nutrients.;Long Term Goal: Adherence to prescribed nutrition plan.;Short Term Goal: A plan has been developed with personal nutrition goals set during dietitian appointment.           Nutrition Assessments:  MEDIFICTS Score Key:  ?70 Need to make dietary changes   40-70 Heart Healthy Diet  ? 40 Therapeutic Level Cholesterol Diet  Flowsheet Row Cardiac Rehab from 10/18/2020 in [facility] Cardiac and Pulmonary Rehab  Picture Your Plate Total Score on Admission 59     Picture Your Plate Scores:  <42 Unhealthy dietary pattern with much room for improvement.  41-50 Dietary pattern unlikely to meet recommendations for good health and room for improvement.  51-60 More healthful dietary pattern, with some room for improvement.   >60 Healthy dietary pattern, although there may be some specific behaviors that could be improved.    Nutrition Goals Re-Evaluation:  Nutrition Goals Re-Evaluation    Row Name 12/08/20 0818 01/14/21 0801 02/04/21 0752         Goals   Current Weight -- 258 lb (117 kg) --     Nutrition Goal ST: prepare lunches, prepare dinner 3x/week LT: cook/prepare most meals at home Lose more weight. Weight goal of 215lbs. Improve sugar control     Comment He reports eating more fruits and vegetables (apples, oranges, salads) and is eating less red meat (1-2x/week now). He is preparring two meals at home now, he reports no barriers at this time and would like to continue working towards his goal. Derek Dickerson states that he has to take a bit of insulin which is making  it hard to lose weight. He is trying to eat smaller meals throughout the day. Derek Dickerson is now exercisng daily and working on his insulin.  He is also trying to eat better and getting in more fruits and vegetables.  His wife is eating a keto like diet by reducing carbs and satruated fats and it is playing into his diet too.  They are getting better overall.     Expected Outcome ST: prepare lunches, prepare dinner 3x/week LT: cook/prepare most meals at home Short: lose 5 pounds in two weeks. Long: reach  a weight of 215lbs Short: COntinue to cut back on sugar long: Continue to work on weight loss.            Nutrition Goals Discharge (Final Nutrition Goals Re-Evaluation):  Nutrition Goals Re-Evaluation - 02/04/21 0752      Goals   Nutrition Goal Improve sugar control    Comment Derek Dickerson is now exercisng daily and working on his insulin.  He is also trying to eat better and getting in more fruits and vegetables.  His wife is eating a keto like diet by reducing carbs and satruated fats and it is playing into his diet too.  They are getting better overall.    Expected Outcome Short: COntinue to cut back on sugar long: Continue to work on weight loss.           Psychosocial: Target Goals: Acknowledge presence or absence of significant depression and/or stress, maximize coping skills, provide positive support system. Participant is able to verbalize types and ability to use techniques and skills needed for reducing stress and depression.   Education: Stress, Anxiety, and Depression - Group verbal and visual presentation to define topics covered.  Reviews how body is impacted by stress, anxiety, and depression.  Also discusses healthy ways to reduce stress and to treat/manage anxiety and depression.  Written material given at graduation. Flowsheet Row Cardiac Rehab from 02/23/2021 in [facility] Cardiac and Pulmonary Rehab  Date 11/10/20  Educator [facility]  Instruction Review Code 1- Verbalizes Understanding       Education: Sleep Hygiene -Provides group verbal and written instruction about how sleep can affect your health.  Define sleep hygiene, discuss sleep cycles and impact of sleep habits. Review good sleep hygiene tips.    Initial Review & Psychosocial Screening:  Initial Psych Review & Screening - 10/13/20 1542      Initial Review   Current issues with History of Depression;Current Depression      Family Dynamics   Good Support System? Yes    Comments He can look to his wife and his three dogs.He has a posiive outlook on his health. Derek Dickerson takes medication for his mood and states that it helps.      Barriers   Psychosocial barriers to participate in program The patient should benefit from training in stress management and relaxation.      Screening Interventions   Interventions Program counselor consult;To provide support and resources with identified psychosocial needs;Encouraged to exercise;Provide feedback about the scores to participant           Quality of Life Scores:   Quality of Life - 10/18/20 1642      Quality of Life   Select Quality of Life      Quality of Life Scores   Health/Function Pre 20.57 %    Socioeconomic Pre 26.57 %    Psych/Spiritual Pre 30 %    Family Pre 28.5 %    GLOBAL Pre 24.94 %          Scores of 19 and below usually indicate a poorer quality of life in these areas.  A difference of  2-3 points is a clinically meaningful difference.  A difference of 2-3 points in the total score of the Quality of Life Index has been associated with significant improvement in overall quality of life, self-image, physical symptoms, and general health in studies assessing change in quality of life.  PHQ-9: Recent Review Flowsheet Data    Depression screen [facility] 2/9 10/18/2020   Decreased Interest 0  Down, Depressed, Hopeless 0   PHQ - 2 Score 0   Altered sleeping 0   Tired, decreased energy 0   Change in appetite 0   Feeling bad or failure about yourself  0    Trouble concentrating 0   Moving slowly or fidgety/restless 0   Suicidal thoughts 0   PHQ-9 Score 0     Interpretation of Total Score  Total Score Depression Severity:  1-4 = Minimal depression, 5-9 = Mild depression, 10-14 = Moderate depression, 15-19 = Moderately severe depression, 20-27 = Severe depression   Psychosocial Evaluation and Intervention:  Psychosocial Evaluation - 10/13/20 1545      Psychosocial Evaluation & Interventions   Interventions Encouraged to exercise with the program and follow exercise prescription;Relaxation education;Stress management education    Comments He can look to his wife and his three dogs.He has a posiive outlook on his health. Derek Dickerson takes medication for his mood and states that it helps.    Expected Outcomes Short: Exercise regularly to support mental health and notify staff of any changes. Long: maintain mental health and well being through teaching of rehab or prescribed medications independently.    Continue Psychosocial Services  Follow up required by staff           Psychosocial Re-Evaluation:  Psychosocial Re-Evaluation    [place] Name 11/10/20 [phone] 12/08/20 0817 01/14/21 0804 02/04/21 0755       Psychosocial Re-Evaluation   Current issues with -- -- None Identified None Identified    Comments Derek Dickerson reports no stress, depression or anxiety symptoms.  He sleeps well most of the time. Derek Dickerson reports no stress, depression or anxiety symptoms.  He sleeps well most of the time: getting about 8 hours of sleep - he is up about every 2 hours to go to the bathroom. Patient reports no issues with their current mental states, sleep, stress, depression or anxiety. Will follow up with patient in a few weeks for any changes. Derek Dickerson is doing well mentally.  He sleeps well and denies any major stressors.  He is excited to transistion off monitor next week.  His is about 2/3 through program and doing well.    Expected Outcomes Short: continue to attend Heart Track  to help manage  stress/sleep Long: maintain positive outlook Short: continue to attend Heart Track to help manage  stress/sleep Long: maintain positive outlook Short: Continue to exercise regularly to support mental health and notify staff of any changes. Long: maintain mental health and well being through teaching of rehab or prescribed medications independently. Short: Continue to focus on positive Long; Continue to stay healthy mentally.    Interventions -- Encouraged to attend Cardiac Rehabilitation for the exercise Encouraged to attend Cardiac Rehabilitation for the exercise Encouraged to attend Cardiac Rehabilitation for the exercise    Continue Psychosocial Services  -- Follow up required by staff Follow up required by staff Follow up required by staff           Psychosocial Discharge (Final Psychosocial Re-Evaluation):  Psychosocial Re-Evaluation - 02/04/21 0755      Psychosocial Re-Evaluation   Current issues with None Identified    Comments Derek Dickerson is doing well mentally.  He sleeps well and denies any major stressors.  He is excited to transistion off monitor next week.  His is about 2/3 through program and doing well.    Expected Outcomes Short: Continue to focus on positive Long; Continue to stay healthy mentally.    Interventions Encouraged to attend Cardiac  Rehabilitation for the exercise    Continue Psychosocial Services  Follow up required by staff           Vocational Rehabilitation: Provide vocational rehab assistance to qualifying candidates.   Vocational Rehab Evaluation & Intervention:   Education: Education Goals: Education classes will be provided on a variety of topics geared toward better understanding of heart health and risk factor modification. Participant will state understanding/return demonstration of topics presented as noted by education test scores.  Learning Barriers/Preferences:  Learning Barriers/Preferences - 10/13/20 1541      Learning  Barriers/Preferences   Learning Barriers None    Learning Preferences None           General Cardiac Education Topics:  AED/CPR: - Group verbal and written instruction with the use of models to demonstrate the basic use of the AED with the basic ABC's of resuscitation.   Anatomy and Cardiac Procedures: - Group verbal and visual presentation and models provide information about basic cardiac anatomy and function. Reviews the testing methods done to diagnose heart disease and the outcomes of the test results. Describes the treatment choices: Medical Management, Angioplasty, or Coronary Bypass Surgery for treating various heart conditions including Myocardial Infarction, Angina, Valve Disease, and Cardiac Arrhythmias.  Written material given at graduation. Flowsheet Row Cardiac Rehab from 02/23/2021 in [facility] Cardiac and Pulmonary Rehab  Date 02/02/21  Educator SB  Instruction Review Code 1- Verbalizes Understanding      Medication Safety: - Group verbal and visual instruction to review commonly prescribed medications for heart and lung disease. Reviews the medication, class of the drug, and side effects. Includes the steps to properly store meds and maintain the prescription regimen.  Written material given at graduation. Flowsheet Row Cardiac Rehab from 02/23/2021 in [facility] Cardiac and Pulmonary Rehab  Date 02/23/21  Educator SB  Instruction Review Code 1- Verbalizes Understanding      Intimacy: - Group verbal instruction through game format to discuss how heart and lung disease can affect sexual intimacy. Written material given at graduation..   Know Your Numbers and Heart Failure: - Group verbal and visual instruction to discuss disease risk factors for cardiac and pulmonary disease and treatment options.  Reviews associated critical values for Overweight/Obesity, Hypertension, Cholesterol, and Diabetes.  Discusses basics of heart failure: signs/symptoms and treatments.  Introduces  Heart Failure Zone chart for action plan for heart failure.  Written material given at graduation.   Infection Prevention: - Provides verbal and written material to individual with discussion of infection control including proper hand washing and proper equipment cleaning during exercise session. Flowsheet Row Cardiac Rehab from 02/23/2021 in [facility] Cardiac and Pulmonary Rehab  Date 10/18/20  Educator AS  Instruction Review Code 1- Verbalizes Understanding      Falls Prevention: - Provides verbal and written material to individual with discussion of falls prevention and safety. Flowsheet Row Cardiac Rehab from 02/23/2021 in [facility] Cardiac and Pulmonary Rehab  Date 10/18/20  Educator AS  Instruction Review Code 1- Verbalizes Understanding      Other: -Provides group and verbal instruction on various topics (see comments)   Knowledge Questionnaire Score:  Knowledge Questionnaire Score - 10/18/20 1639      Knowledge Questionnaire Score   Pre Score 21/26 exercise nutrition           Core Components/Risk Factors/Patient Goals at Admission:  Personal Goals and Risk Factors at Admission - 10/18/20 1643      Core Components/Risk Factors/Patient Goals on Admission  Weight Management Weight Loss;Yes    Intervention Weight Management: Develop a combined nutrition and exercise program designed to reach desired caloric intake, while maintaining appropriate intake of nutrient and fiber, sodium and fats, and appropriate energy expenditure required for the weight goal.;Weight Management: Provide education and appropriate resources to help participant work on and attain dietary goals.;Weight Management/Obesity: Establish reasonable short term and long term weight goals.    Admit Weight 260 lb 8 oz (118.2 kg)    Goal Weight: Short Term 255 lb (115.7 kg)    Goal Weight: Long Term 225 lb (102.1 kg)    Expected Outcomes Long Term: Adherence to nutrition and physical activity/exercise program aimed  toward attainment of established weight goal;Short Term: Continue to assess and modify interventions until short term weight is achieved;Weight Loss: Understanding of general recommendations for a balanced deficit meal plan, which promotes 1-2 lb weight loss per week and includes a negative energy balance of [phone] kcal/d;Understanding recommendations for meals to include 15-35% energy as protein, 25-35% energy from fat, 35-60% energy from carbohydrates, less than 248m of dietary cholesterol, 20-35 gm of total fiber daily;Understanding of distribution of calorie intake throughout the day with the consumption of 4-5 meals/snacks    Diabetes Yes    Intervention Provide education about signs/symptoms and action to take for hypo/hyperglycemia.;Provide education about proper nutrition, including hydration, and aerobic/resistive exercise prescription along with prescribed medications to achieve blood glucose in normal ranges: Fasting glucose 65-99 mg/dL    Expected Outcomes Short Term: Participant verbalizes understanding of the signs/symptoms and immediate care of hyper/hypoglycemia, proper foot care and importance of medication, aerobic/resistive exercise and nutrition plan for blood glucose control.;Long Term: Attainment of HbA1C < 7%.    Hypertension Yes    Intervention Provide education on lifestyle modifcations including regular physical activity/exercise, weight management, moderate sodium restriction and increased consumption of fresh fruit, vegetables, and low fat dairy, alcohol moderation, and smoking cessation.;Monitor prescription use compliance.    Expected Outcomes Short Term: Continued assessment and intervention until BP is < 140/914mHG in hypertensive participants. < 130/8086mG in hypertensive participants with diabetes, heart failure or chronic kidney disease.;Long Term: Maintenance of blood pressure at goal levels.    Lipids Yes    Intervention Provide education and support for participant  on nutrition & aerobic/resistive exercise along with prescribed medications to achieve LDL <45m25mDL >40mg88m Expected Outcomes Short Term: Participant states understanding of desired cholesterol values and is compliant with medications prescribed. Participant is following exercise prescription and nutrition guidelines.;Long Term: Cholesterol controlled with medications as prescribed, with individualized exercise RX and with personalized nutrition plan. Value goals: LDL < 45mg,40m > 40 mg.           Education:Diabetes - Individual verbal and written instruction to review signs/symptoms of diabetes, desired ranges of glucose level fasting, after meals and with exercise. Acknowledge that pre and post exercise glucose checks will be done for 3 sessions at entry of program. [place]  Date 10/18/20  Educator AS  Instruction Review Code 1- Verbalizes Understanding      Core Components/Risk Factors/Patient Goals Review:   Goals and Risk Factor Review    Row Name 11/10/20 0809 12/08/20 0812 07989/[id number] 0758 02/04/21 0751       Core Components/Risk Factors/Patient Goals Review   Personal Goals Review Weight Management/Obesity;Lipids;Diabetes;Hypertension Weight Management/Obesity;Lipids;Diabetes;Hypertension Weight Management/Obesity;Diabetes Weight Management/Obesity;Diabetes;Hypertension    Review Mike iRonalee Beltsking all medications as directed.  He does check BP and BG at home.  Today FBG was 126.  He has met with RD and is watching portion sizes.  He will have blood work to recheck cholesterol etc in a couple months. He reports he has lost over 10 pounds in the hospital - likely muscle tissue - his weight has been stable while he has been at rehab. Derek Dickerson is taking all medications as directed. He checks BP and BG at home; BG ~150, BP 140/80.  He will have blood work to recheck cholesterol etc in a couple months. He continues to work on  his diet and attend rehab. Derek Dickerson has been checking his sugar at home routinely. He is trying to keep his sugar below 130 fasting. When his sugar is higher its because he eats later. He is good at eating earlier in the day and has to eat small meals throughout the day. Derek Dickerson is working to improve his blood sugars.  His A1c is down to 6.5!!  He is struggling with his insulin control as he is now exercising daily.  He is trying. His weight is coming down and was 254 lb today!!  His pressures are doing well.    Expected Outcomes Short:  continue to take meds as directed and monitor risk factors Long:  keep risk factors managed Short:  continue to take meds as directed and monitor risk factors Long:  keep risk factors managed Short: continue to check sugar and eat small meals. Long: get fasting BG below 130. Short: Continue to keep close eye on sugars Long: Continue to improve A1c and control.           Core Components/Risk Factors/Patient Goals at Discharge (Final Review):   Goals and Risk Factor Review - 02/04/21 0751      Core Components/Risk Factors/Patient Goals Review   Personal Goals Review Weight Management/Obesity;Diabetes;Hypertension    Review Derek Dickerson is working to improve his blood sugars.  His A1c is down to 6.5!!  He is struggling with his insulin control as he is now exercising daily.  He is trying. His weight is coming down and was 254 lb today!!  His pressures are doing well.    Expected Outcomes Short: Continue to keep close eye on sugars Long: Continue to improve A1c and control.           ITP Comments:  ITP Comments    Row Name 10/13/20 1540 10/18/20 1646 11/01/20 0836 11/03/20 0957 11/08/20 1114   ITP Comments Virtual Visit completed. Patient informed on EP and RD appointment and 6 Minute walk test. Patient also informed of patient health questionnaires on My Chart. Patient Verbalizes understanding. Visit diagnosis can be found in [facility]12/22/2021. Completed 6MWT and gym orientation.  Initial ITP created and sent for review to Dr. Emily Filbert, Medical Director. First full day of exercise!  Patient was oriented to gym and equipment including functions, settings, policies, and procedures.  Patient's individual exercise prescription and treatment plan were reviewed.  All starting workloads were established based on the results of the 6 minute walk test done at initial orientation visit.  The plan for exercise progression was also introduced and progression will be customized based on patient's performance and goals. 30 Day review completed. Medical Director ITP review done, changes made as directed, and signed approval by Medical Director.  New to program Completed initial RD evaluation   Row Name 11/23/20 1537 12/01/20 0713 12/02/20 1309 12/08/20 0808 12/22/20 1031   ITP Comments Pt called out sick  last week. Currently admitted to [facility] on 2/11 with pancreatitis. On going fever >10 days not responding to antibiotics 30 Day review completed. Medical Director ITP review done, changes made as directed, and signed approval by Medical Director.  REmains out for medical reasons Called to check in on pt.  He is home and seen in f/u.  Out for week.  He is scheduled to return on Wed 3/2. Patient returned today and provided letter from MD illustrating approval and safety to continue with rehab. Pt called out today with a fever again.   [place] Name 12/29/20 1127 01/24/21 0841 01/26/21 0639 02/23/21 0958     ITP Comments 30 Day review completed. Medical Director ITP review done, changes made as directed, and signed approval by Medical Director. Blood sugar started dropping. Had juice with resolution of symptoms and then ate a pack of cra 30 Day review completed. Medical Director ITP review done, changes made as directed, and signed approval by Medical Director. 30 Day review completed. Medical Director ITP review done, changes made as directed, and signed approval by Medical Director.            Comments:

## 2021-02-28 ENCOUNTER — Encounter: Payer: Medicare Other | Admitting: *Deleted

## 2021-02-28 ENCOUNTER — Other Ambulatory Visit: Payer: Self-pay

## 2021-02-28 DIAGNOSIS — Z9861 Coronary angioplasty status: Secondary | ICD-10-CM

## 2021-02-28 NOTE — Progress Notes (Signed)
Daily Session Note  Patient Details  Name: Derek Dickerson MRN: [id number] Date of Birth: 12-28-1960 Referring Provider:   Flowsheet Row Cardiac Rehab from 10/18/2020 in [facility] Cardiac and Pulmonary Rehab  Referring Provider [name]      Encounter Date: 02/28/2021  Check In:  Session Check In - 02/28/21 0842      Check-In   Supervising physician immediately available to respond to emergencies See telemetry face sheet for immediately available ER MD    Location [facility]-Cardiac & Pulmonary Rehab    Staff Present Heath Lark, RN, BSN, CCRP;Joseph Hood RCP,RRT,BSRT;Kelly [place], [place], ACSM CEP, Exercise Physiologist    Virtual Visit No    Medication changes reported     No    Fall or balance concerns reported    No    Warm-up and Cool-down Performed on first and last piece of equipment    Resistance Training Performed Yes    VAD Patient? No    PAD/SET Patient? No      Pain Assessment   Currently in Pain? No/denies              Social History   Tobacco Use  Smoking Status Former Smoker  . Packs/day: 1.00  . Years: 40.00  . Pack years: 40.00  . Types: Cigarettes  . Quit date: 2013  . Years since quitting: 9.3  Smokeless Tobacco Never Used    Goals Met:  Independence with exercise equipment Exercise tolerated well No report of cardiac concerns or symptoms  Goals Unmet:  Not Applicable  Comments: Pt able to follow exercise prescription today without complaint.  Will continue to monitor for progression.    Dr. Emily Filbert is Medical Director for [place].  Dr. Ottie Glazier is Medical Director for [facility] Pulmonary Rehabilitation.

## 2021-03-02 ENCOUNTER — Other Ambulatory Visit: Payer: Self-pay

## 2021-03-02 DIAGNOSIS — Z9861 Coronary angioplasty status: Secondary | ICD-10-CM

## 2021-03-02 NOTE — Progress Notes (Signed)
Daily Session Note  Patient Details  Name: Derek Dickerson MRN: [id number] Date of Birth: 14-Nov-1960 Referring Provider:   Flowsheet Row Cardiac Rehab from 10/18/2020 in [facility] Cardiac and Pulmonary Rehab  Referring Provider [name]      Encounter Date: 03/02/2021  Check In:  Session Check In - 03/02/21 0755      Check-In   Supervising physician immediately available to respond to emergencies See telemetry face sheet for immediately available ER MD    Location [facility]-Cardiac & Pulmonary Rehab    Staff Present Birdie Sons, MPA, Elveria Rising, BA, ACSM CEP, Exercise Physiologist;Joseph Tessie Fass RCP,RRT,BSRT    Virtual Visit No    Medication changes reported     No    Fall or balance concerns reported    No    Tobacco Cessation No Change    Warm-up and Cool-down Performed on first and last piece of equipment    Resistance Training Performed Yes    VAD Patient? No    PAD/SET Patient? No      Pain Assessment   Currently in Pain? No/denies              Social History   Tobacco Use  Smoking Status Former Smoker  . Packs/day: 1.00  . Years: 40.00  . Pack years: 40.00  . Types: Cigarettes  . Quit date: 2013  . Years since quitting: 9.4  Smokeless Tobacco Never Used    Goals Met:  Independence with exercise equipment Exercise tolerated well No report of cardiac concerns or symptoms Strength training completed today  Goals Unmet:  Not Applicable  Comments: Pt able to follow exercise prescription today without complaint.  Will continue to monitor for progression.    Dr. Emily Filbert is Medical Director for [place].  Dr. Ottie Glazier is Medical Director for [facility] Pulmonary Rehabilitation.

## 2021-03-04 ENCOUNTER — Encounter: Payer: Medicare Other | Admitting: *Deleted

## 2021-03-04 ENCOUNTER — Other Ambulatory Visit: Payer: Self-pay

## 2021-03-04 DIAGNOSIS — Z9861 Coronary angioplasty status: Secondary | ICD-10-CM

## 2021-03-04 NOTE — Progress Notes (Signed)
Daily Session Note  Patient Details  Name: Derek Dickerson MRN: [id number] Date of Birth: [birth year] Referring Provider:   Flowsheet Row Cardiac Rehab from 10/18/2020 in [facility] Cardiac and Pulmonary Rehab  Referring Provider [name]      Encounter Date: 03/04/2021  Check In:  Session Check In - 03/04/21 0839      Check-In   Supervising physician immediately available to respond to emergencies See telemetry face sheet for immediately available ER MD    Location [facility]-Cardiac & Pulmonary Rehab    Staff Present Heath Lark, RN, BSN, CCRP;Jessica K. I. Sawyer, MA, RCEP, CCRP, Marylynn Pearson, MS, ASCM CEP, Exercise Physiologist    Virtual Visit No    Medication changes reported     No    Fall or balance concerns reported    No    Warm-up and Cool-down Performed on first and last piece of equipment    Resistance Training Performed Yes    VAD Patient? No    PAD/SET Patient? No      Pain Assessment   Currently in Pain? No/denies              Social History   Tobacco Use  Smoking Status Former Smoker  . Packs/day: 1.00  . Years: 40.00  . Pack years: 40.00  . Types: Cigarettes  . Quit date: 2013  . Years since quitting: 9.4  Smokeless Tobacco Never Used    Goals Met:  Independence with exercise equipment Exercise tolerated well No report of cardiac concerns or symptoms  Goals Unmet:  Not Applicable  Comments: Pt able to follow exercise prescription today without complaint.  Will continue to monitor for progression.    Dr. Emily Filbert is Medical Director for [name].  Dr. Ottie Glazier is Medical Director for [name] Pulmonary Rehabilitation.

## 2021-03-09 ENCOUNTER — Other Ambulatory Visit: Payer: Self-pay

## 2021-03-09 ENCOUNTER — Encounter: Payer: Medicare Other | Attending: Cardiology | Admitting: *Deleted

## 2021-03-09 DIAGNOSIS — Z9861 Coronary angioplasty status: Secondary | ICD-10-CM | POA: Diagnosis present

## 2021-03-09 NOTE — Progress Notes (Signed)
Daily Session Note  Patient Details  Name: Hakan Nudelman MRN: [id number] Date of Birth: 1961/05/19 Referring Provider:   Flowsheet Row Cardiac Rehab from 10/18/2020 in [facility] Cardiac and Pulmonary Rehab  Referring Provider [name]      Encounter Date: 03/09/2021  Check In:  Session Check In - 03/09/21 0844      Check-In   Supervising physician immediately available to respond to emergencies See telemetry face sheet for immediately available ER MD    Location [facility]-Cardiac & Pulmonary Rehab    Staff Present Heath Lark, RN, BSN, CCRP;Melissa Kingman RDN, Rowe Pavy, BA, ACSM CEP, Exercise Physiologist    Virtual Visit No    Medication changes reported     No    Fall or balance concerns reported    No    Warm-up and Cool-down Performed on first and last piece of equipment    Resistance Training Performed Yes    VAD Patient? No    PAD/SET Patient? No      Pain Assessment   Currently in Pain? No/denies              Social History   Tobacco Use  Smoking Status Former Smoker  . Packs/day: 1.00  . Years: 40.00  . Pack years: 40.00  . Types: Cigarettes  . Quit date: 2013  . Years since quitting: 9.4  Smokeless Tobacco Never Used    Goals Met:  Independence with exercise equipment Exercise tolerated well No report of cardiac concerns or symptoms  Goals Unmet:  Not Applicable  Comments: Pt able to follow exercise prescription today without complaint.  Will continue to monitor for progression.    Dr. Emily Filbert is Medical Director for [place].  Dr. Ottie Glazier is Medical Director for [facility] Pulmonary Rehabilitation.

## 2021-03-11 ENCOUNTER — Encounter: Payer: Medicare Other | Admitting: *Deleted

## 2021-03-11 ENCOUNTER — Other Ambulatory Visit: Payer: Self-pay

## 2021-03-11 DIAGNOSIS — Z9861 Coronary angioplasty status: Secondary | ICD-10-CM

## 2021-03-11 NOTE — Patient Instructions (Addendum)
Discharge Patient Instructions  Patient Details  Name: Derek Dickerson MRN: [id number] Date of Birth: [birth year] Referring Provider:  Gae Gallop, *   Number of Visits: 36  Reason for Discharge:  Patient reached a stable level of exercise. Patient independent in their exercise. Patient has met program and personal goals.  Smoking History:  Social History   Tobacco Use  Smoking Status Former Smoker  . Packs/day: 1.00  . Years: 40.00  . Pack years: 40.00  . Types: Cigarettes  . Quit date: 2013  . Years since quitting: 9.4  Smokeless Tobacco Never Used    Diagnosis:  S/P PTCA (percutaneous transluminal coronary angioplasty)  Initial Exercise Prescription:  Initial Exercise Prescription - 10/18/20 1600      Date of Initial Exercise RX and Referring Provider   Date 10/18/20    Referring Provider Margaretha Glassing      Treadmill   MPH 2.5    Grade 0.5    Minutes 15    METs 3.3      REL-XR   Level 3    Speed 50    Minutes 15    METs 3.3      T5 Nustep   Level 2    SPM 80    Minutes 15    METs 3.3      Prescription Details   Frequency (times per week) 3    Duration Progress to 30 minutes of continuous aerobic without signs/symptoms of physical distress      Intensity   THRR 40-80% of Max Heartrate 109-142    Ratings of Perceived Exertion 11-15    Perceived Dyspnea 0-4      Resistance Training   Training Prescription Yes    Weight 4 lb    Reps 10-15           Discharge Exercise Prescription (Final Exercise Prescription Changes):  Exercise Prescription Changes - 03/01/21 1200      Response to Exercise   Blood Pressure (Admit) 142/80    Blood Pressure (Exercise) 152/64    Blood Pressure (Exit) 138/66    Heart Rate (Admit) 93 bpm    Heart Rate (Exercise) 104 bpm    Heart Rate (Exit) 90 bpm    Oxygen Saturation (Admit) 95 %    Oxygen Saturation (Exercise) 96 %    Oxygen Saturation (Exit) 98 %    Rating of Perceived Exertion (Exercise) 15     Symptoms none    Duration Continue with 30 min of aerobic exercise without signs/symptoms of physical distress.    Intensity THRR unchanged      Progression   Progression Continue to progress workloads to maintain intensity without signs/symptoms of physical distress.    Average METs 3.91      Resistance Training   Training Prescription Yes    Weight 5 lb    Reps 10-15      Interval Training   Interval Training No      Treadmill   MPH 2.8    Grade 2    Minutes 30    METs 3.91      Home Exercise Plan   Plans to continue exercise at [facility]    Frequency Add 2 additional days to program exercise sessions.    Initial Home Exercises Provided 11/10/20           Functional Capacity:  6 Minute Walk    Row Name 10/18/20 1634 02/23/21 1045       6 Minute Walk  Phase Initial Discharge    Distance 1400 feet 1548 feet    Distance % Change -- 10.5 %    Distance Feet Change -- 148 ft    Walk Time 6 minutes 6 minutes    # of Rest Breaks 0 0    MPH 2.65 2.93    METS 3.3 3.5    RPE 12 10    Perceived Dyspnea  1 0    VO2 Peak 11.5 12.3    Symptoms No No    Resting HR 75 bpm 69 bpm    Resting BP 136/72 132/68    Resting Oxygen Saturation  99 % 98 %    Exercise Oxygen Saturation  during 6 min walk 98 % 99 %    Max Ex. HR 103 bpm 103 bpm    Max Ex. BP 160/76 152/70    2 Minute Post BP 134/60 --          Nutrition:  Nutrition Therapy & Goals - 11/08/20 1047      Nutrition Therapy   Diet Heart healthy, low Na, diabetes friendly    Drug/Food Interactions Statins/Certain Fruits    Protein (specify units) 95g    Fiber 30 grams    Whole Grain Foods 3 servings    Saturated Fats 12 max. grams    Fruits and Vegetables 8 servings/day    Sodium 1.5 grams      Personal Nutrition Goals   Nutrition Goal ST: prepare lunches, prepare dinner 3x/week LT: cook/prepare most meals at home    Comments A1C 8 (January 2022). Insulin - short acting and long lasting. When his  BG gets too low he uses orange juice. He feels he eats too much fatty foods. B: 3 tbsp - vanilla yogurt with 1/2 cup granola L: frozen meals D: fast food, frozen foods S: potato chips Drinks: diet soda and sweet tea (splenda). Discussed heart healthy eating and diabetes friendly eating. He feels he does not have a good diet and would like to start preparing his own meals as he feels that was the barrier for him to eat healthier.      Intervention Plan   Intervention Prescribe, educate and counsel regarding individualized specific dietary modifications aiming towards targeted core components such as weight, hypertension, lipid management, diabetes, heart failure and other comorbidities.;Nutrition handout(s) given to patient.    Expected Outcomes Short Term Goal: Understand basic principles of dietary content, such as calories, fat, sodium, cholesterol and nutrients.;Long Term Goal: Adherence to prescribed nutrition plan.;Short Term Goal: A plan has been developed with personal nutrition goals set during dietitian appointment.          Goals reviewed with patient; copy given to patient.

## 2021-03-11 NOTE — Progress Notes (Signed)
Cardiac Individual Treatment Plan  Patient Details  Name: Derek Dickerson MRN: [id number] Date of Birth: 03/20/1961 Referring Provider:   Flowsheet Row Cardiac Rehab from 10/18/2020 in [facility] Cardiac and Pulmonary Rehab  Referring Provider [name]      Initial Encounter Date:  Flowsheet Row Cardiac Rehab from 10/18/2020 in [facility] Cardiac and Pulmonary Rehab  Date 10/18/20      Visit Diagnosis: S/P PTCA (percutaneous transluminal coronary angioplasty)  Patient's Home Medications on Admission:  Current Outpatient Medications:  .  aspirin 81 MG EC tablet, Take by mouth., Disp: , Rfl:  .  Blood Glucose Monitoring Suppl (FIFTY50 GLUCOSE METER 2.0) w/Device KIT, Tests 8 times a day, One touch Ultra 2 glucometer, E08.9, Disp: , Rfl:  .  buPROPion (WELLBUTRIN XL) 300 MG 24 hr tablet, Take by mouth., Disp: , Rfl:  .  cetirizine-pseudoephedrine (ZYRTEC-D) 5-120 MG tablet, Take by mouth., Disp: , Rfl:  .  Cholecalciferol 25 MCG (1000 UT) tablet, Take by mouth., Disp: , Rfl:  .  clopidogrel (PLAVIX) 75 MG tablet, Take by mouth., Disp: , Rfl:  .  EPINEPHrine 0.3 mg/0.3 mL IJ SOAJ injection, Inject into the muscle., Disp: , Rfl:  .  ferrous sulfate 325 (65 FE) MG tablet, Take by mouth., Disp: , Rfl:  .  gabapentin (NEURONTIN) 100 MG capsule, Take by mouth., Disp: , Rfl:  .  glucose blood (PRECISION QID TEST) test strip, Use one. 5 (five) to 8 (eight) times daily as needed. Use as instructed. ONE TOUCH ULTRA, Disp: , Rfl:  .  insulin glargine (LANTUS) 100 UNIT/ML Solostar Pen, Inject into the skin., Disp: , Rfl:  .  insulin lispro (HUMALOG) 100 UNIT/ML KwikPen, INJECT 20-30 UNITS WITH MEALS OR SNACKS UP TO FIVE TIMES DAILY (MAX OF 140 UNITS PER DAY), Disp: , Rfl:  .  Insulin Pen Needle (B-D UF III MINI PEN NEEDLES) 31G X 5 MM MISC, USE WITH INSULIN 7 TIMES DAILY, Disp: , Rfl:  .  omeprazole (PRILOSEC) 40 MG capsule, Take by mouth., Disp: , Rfl:  .  oxyCODONE (OXY IR/ROXICODONE) 5 MG immediate release  tablet, Take by mouth., Disp: , Rfl:  .  Pancrelipase, Lip-Prot-Amyl, (CREON) 24000-76000 units CPEP, Take by mouth., Disp: , Rfl:  .  rosuvastatin (CRESTOR) 20 MG tablet, Take by mouth., Disp: , Rfl:   Past Medical History: Past Medical History:  Diagnosis Date  . Diabetes mellitus without complication ([place])     Tobacco Use: Social History   Tobacco Use  Smoking Status Former Smoker  . Packs/day: 1.00  . Years: 40.00  . Pack years: 40.00  . Types: Cigarettes  . Quit date: 2013  . Years since quitting: 9.4  Smokeless Tobacco Never Used    Labs: Recent Review Flowsheet Data   There is no flowsheet data to display.      Exercise Target Goals: Exercise Program Goal: Individual exercise prescription set using results from initial 6 min walk test and THRR while considering  patient's activity barriers and safety.   Exercise Prescription Goal: Initial exercise prescription builds to 30-45 minutes a day of aerobic activity, 2-3 days per week.  Home exercise guidelines will be given to patient during program as part of exercise prescription that the participant will acknowledge.   Education: Aerobic Exercise: - Group verbal and visual presentation on the components of exercise prescription. Introduces F.I.T.T principle from ACSM for exercise prescriptions.  Reviews F.I.T.T. principles of aerobic exercise including progression. Written material given at graduation.   Education: Resistance Exercise: -  Group verbal and visual presentation on the components of exercise prescription. Introduces F.I.T.T principle from ACSM for exercise prescriptions  Reviews F.I.T.T. principles of resistance exercise including progression. Written material given at graduation. Flowsheet Row Cardiac Rehab from 02/23/2021 in [facility] Cardiac and Pulmonary Rehab  Date 02/02/21  Educator AS  Instruction Review Code 1- Verbalizes Understanding       Education: Exercise & Equipment Safety: - Individual  verbal instruction and demonstration of equipment use and safety with use of the equipment. Flowsheet Row Cardiac Rehab from 02/23/2021 in [facility] Cardiac and Pulmonary Rehab  Date 10/18/20  Educator AS  Instruction Review Code 1- Verbalizes Understanding      Education: Exercise Physiology & General Exercise Guidelines: - Group verbal and written instruction with models to review the exercise physiology of the cardiovascular system and associated critical values. Provides general exercise guidelines with specific guidelines to those with heart or lung disease.  Flowsheet Row Cardiac Rehab from 02/23/2021 in [facility] Cardiac and Pulmonary Rehab  Date 01/19/21  Educator [facility]  Instruction Review Code 1- Verbalizes Understanding      Education: Flexibility, Balance, Mind/Body Relaxation: - Group verbal and visual presentation with interactive activity on the components of exercise prescription. Introduces F.I.T.T principle from ACSM for exercise prescriptions. Reviews F.I.T.T. principles of flexibility and balance exercise training including progression. Also discusses the mind body connection.  Reviews various relaxation techniques to help reduce and manage stress (i.e. Deep breathing, progressive muscle relaxation, and visualization). Balance handout provided to take home. Written material given at graduation.   Activity Barriers & Risk Stratification:   6 Minute Walk:  6 Minute Walk    Row Name 10/18/20 1634 02/23/21 1045       6 Minute Walk   Phase Initial Discharge    Distance 1400 feet 1548 feet    Distance % Change -- 10.5 %    Distance Feet Change -- 148 ft    Walk Time 6 minutes 6 minutes    # of Rest Breaks 0 0    MPH 2.65 2.93    METS 3.3 3.5    RPE 12 10    Perceived Dyspnea  1 0    VO2 Peak 11.5 12.3    Symptoms No No    Resting HR 75 bpm 69 bpm    Resting BP 136/72 132/68    Resting Oxygen Saturation  99 % 98 %    Exercise Oxygen Saturation  during 6 min walk 98 % 99 %     Max Ex. HR 103 bpm 103 bpm    Max Ex. BP 160/76 152/70    2 Minute Post BP 134/60 --           Oxygen Initial Assessment:   Oxygen Re-Evaluation:   Oxygen Discharge (Final Oxygen Re-Evaluation):   Initial Exercise Prescription:  Initial Exercise Prescription - 10/18/20 1600      Date of Initial Exercise RX and Referring Provider   Date 10/18/20    Referring Provider [name]      Treadmill   MPH 2.5    Grade 0.5    Minutes 15    METs 3.3      REL-XR   Level 3    Speed 50    Minutes 15    METs 3.3      T5 Nustep   Level 2    SPM 80    Minutes 15    METs 3.3      Prescription Details  Frequency (times per week) 3    Duration Progress to 30 minutes of continuous aerobic without signs/symptoms of physical distress      Intensity   THRR 40-80% of Max Heartrate 109-142    Ratings of Perceived Exertion 11-15    Perceived Dyspnea 0-4      Resistance Training   Training Prescription Yes    Weight 4 lb    Reps 10-15           Perform Capillary Blood Glucose checks as needed.  Exercise Prescription Changes:  Exercise Prescription Changes    Row Name 10/18/20 1600 11/08/20 1600 11/10/20 0800 11/23/20 1500 12/08/20 1600     Response to Exercise   Blood Pressure (Admit) 136/72 130/82 -- 136/56 130/60   Blood Pressure (Exercise) 160/76 140/78 -- 144/70 158/58   Blood Pressure (Exit) 134/60 142/86 -- 122/60 132/74   Heart Rate (Admit) 75 bpm 76 bpm -- 75 bpm 86 bpm   Heart Rate (Exercise) 103 bpm 96 bpm -- 97 bpm 111 bpm   Heart Rate (Exit) 89 bpm 72 bpm -- 73 bpm 75 bpm   Oxygen Saturation (Admit) 99 % -- -- -- --   Oxygen Saturation (Exercise) 98 % -- -- -- --   Rating of Perceived Exertion (Exercise) 12 13 -- 14 13   Perceived Dyspnea (Exercise) 1 -- -- -- --   Symptoms none none -- none none   Comments -- -- -- from visit on 11/12/20 --   Duration -- Continue with 30 min of aerobic exercise without signs/symptoms of physical distress. -- Continue  with 30 min of aerobic exercise without signs/symptoms of physical distress. Continue with 30 min of aerobic exercise without signs/symptoms of physical distress.   Intensity -- THRR unchanged -- THRR unchanged THRR unchanged     Progression   Progression -- Continue to progress workloads to maintain intensity without signs/symptoms of physical distress. -- Continue to progress workloads to maintain intensity without signs/symptoms of physical distress. Continue to progress workloads to maintain intensity without signs/symptoms of physical distress.   Average METs -- 2.53 -- 2.63 3.5     Resistance Training   Training Prescription -- Yes -- Yes Yes   Weight -- 5 lb -- 5 lb 5 lb   Reps -- 10-15 -- 10-15 10-15     Interval Training   Interval Training -- No -- No --     Treadmill   MPH -- 2.5 -- 2.6 3   Grade -- 0.5 -- 0.5 0.5   Minutes -- 15 -- 15 15   METs -- 3.09 -- 3.17 3.5     REL-XR   Level -- 3 -- -- --   Minutes -- 15 -- -- --   METs -- 2.3 -- -- --     T5 Nustep   Level -- 2 -- 3 --   Minutes -- 15 -- 15 --   METs -- 2.2 -- 2.1 --     Home Exercise Plan   Plans to continue exercise at -- -- [facility]   Frequency -- -- Add 2 additional days to program exercise sessions. Add 2 additional days to program exercise sessions. Add 2 additional days to program exercise sessions.   Initial Home Exercises Provided -- -- 11/10/20 11/10/20 11/10/20   Row Name 12/22/20 1000 01/04/21 1300 01/17/21 1400 02/01/21 1200 02/15/21 1400     Response to Exercise   Blood Pressure (Admit) 122/70 128/68  124/62 124/60 112/82   Blood Pressure (Exercise) 148/82 142/62 162/62 142/64 144/64   Blood Pressure (Exit) 128/70 138/64 124/60 134/68 118/62   Heart Rate (Admit) 90 bpm 69 bpm 88 bpm 74 bpm 69 bpm   Heart Rate (Exercise) 111 bpm 107 bpm 122 bpm 108 bpm 100 bpm   Heart Rate (Exit) 88 bpm 83 bpm 103 bpm 84 bpm 98 bpm   Oxygen Saturation  (Exercise) -- -- -- -- 96 %   Oxygen Saturation (Exit) -- -- -- -- 98 %   Rating of Perceived Exertion (Exercise) 15 15 15 15 14    Symptoms none none none none none   Duration Continue with 30 min of aerobic exercise without signs/symptoms of physical distress. Continue with 30 min of aerobic exercise without signs/symptoms of physical distress. Continue with 30 min of aerobic exercise without signs/symptoms of physical distress. Continue with 30 min of aerobic exercise without signs/symptoms of physical distress. Continue with 30 min of aerobic exercise without signs/symptoms of physical distress.   Intensity THRR unchanged THRR unchanged THRR unchanged THRR unchanged THRR unchanged     Progression   Progression Continue to progress workloads to maintain intensity without signs/symptoms of physical distress. Continue to progress workloads to maintain intensity without signs/symptoms of physical distress. Continue to progress workloads to maintain intensity without signs/symptoms of physical distress. Continue to progress workloads to maintain intensity without signs/symptoms of physical distress. Continue to progress workloads to maintain intensity without signs/symptoms of physical distress.   Average METs 3.27 3.5 3.73 4.53 3.45     Resistance Training   Training Prescription Yes Yes Yes Yes Yes   Weight 5 lb 5 lb 5 lb 5 lb 5 lb   Reps 10-15 10-15 10-15 10-15 10-15     Interval Training   Interval Training No No No No No     Treadmill   MPH 3 3 3  3.5 2.4   Grade 0.5 0.5 0.5 1 1    Minutes 15 15 15 15 15    METs 3.5 3.5 3.5 4.16 3.17     Recumbant Elliptical   Level -- -- -- -- 1   Minutes -- -- -- -- 15   METs -- -- -- -- 2.5     REL-XR   Level 4 -- 5 8 8    Minutes 15 -- 15 15 15    METs 4 -- 5.3 4.9 4.7     T5 Nustep   Level 2 2 2  -- --   Minutes 15 15 15  -- --   METs 2.3 -- 2.4 -- --     Home Exercise Plan   Plans to continue exercise at [place] [facility]   Frequency Add 2 additional days to program exercise sessions. Add 2 additional days to program exercise sessions. Add 2 additional days to program exercise sessions. -- Add 2 additional days to program exercise sessions.   Initial Home Exercises Provided 11/10/20 11/10/20 11/10/20 -- 11/10/20   Row Name 03/01/21 1200             Response to Exercise   Blood Pressure (Admit) 142/80       Blood Pressure (Exercise) 152/64       Blood Pressure (Exit) 138/66       Heart Rate (Admit) 93 bpm       Heart Rate (Exercise) 104 bpm       Heart Rate (Exit) 90 bpm  Oxygen Saturation (Admit) 95 %       Oxygen Saturation (Exercise) 96 %       Oxygen Saturation (Exit) 98 %       Rating of Perceived Exertion (Exercise) 15       Symptoms none       Duration Continue with 30 min of aerobic exercise without signs/symptoms of physical distress.       Intensity THRR unchanged               Progression   Progression Continue to progress workloads to maintain intensity without signs/symptoms of physical distress.       Average METs 3.91               Resistance Training   Training Prescription Yes       Weight 5 lb       Reps 10-15               Interval Training   Interval Training No               Treadmill   MPH 2.8       Grade 2       Minutes 30       METs 3.91               Home Exercise Plan   Plans to continue exercise at [facility]       Frequency Add 2 additional days to program exercise sessions.       Initial Home Exercises Provided 11/10/20              Exercise Comments:  Exercise Comments    Row Name 11/01/20 [phone] 12/08/20 0809 01/24/21 0841 03/11/21 0845     Exercise Comments First full day of exercise!  Patient was oriented to gym and equipment including functions, settings, policies, and procedures.  Patient's individual exercise prescription and treatment plan were reviewed.  All starting workloads  were established based on the results of the 6 minute walk test done at initial orientation visit.  The plan for exercise progression was also introduced and progression will be customized based on patient's performance and goals. Patient returned today and provided letter from MD illustrating approval and safety to continue with rehab. Blood sugar started dropping. Had juice with resolution of symptoms and then ate a pack of cra Thomes graduated today from  rehab with 35 sessions completed.  Details of the patient's exercise prescription and what He needs to do in order to continue the prescription and progress were discussed with patient.  Patient was given a copy of prescription and goals.  Patient verbalized understanding.  Elise plans to continue to exercise by walking at home.           Exercise Goals and Review:  Exercise Goals    Row Name 10/18/20 1637             Exercise Goals   Increase Physical Activity Yes       Intervention Provide advice, education, support and counseling about physical activity/exercise needs.;Develop an individualized exercise prescription for aerobic and resistive training based on initial evaluation findings, risk stratification, comorbidities and participant's personal goals.       Expected Outcomes Short Term: Attend rehab on a regular basis to increase amount of physical activity.;Long Term: Add in home exercise to make exercise part of routine and to increase amount of physical activity.;Long Term: Exercising regularly at least 3-5 days a  week.       Increase Strength and Stamina Yes       Intervention Provide advice, education, support and counseling about physical activity/exercise needs.       Expected Outcomes Short Term: Increase workloads from initial exercise prescription for resistance, speed, and METs.;Short Term: Perform resistance training exercises routinely during rehab and add in resistance training at home;Long Term: Improve  cardiorespiratory fitness, muscular endurance and strength as measured by increased METs and functional capacity (6MWT)       Able to understand and use rate of perceived exertion (RPE) scale Yes       Intervention Provide education and explanation on how to use RPE scale       Expected Outcomes Short Term: Able to use RPE daily in rehab to express subjective intensity level;Long Term:  Able to use RPE to guide intensity level when exercising independently       Able to understand and use Dyspnea scale Yes       Intervention Provide education and explanation on how to use Dyspnea scale       Expected Outcomes Short Term: Able to use Dyspnea scale daily in rehab to express subjective sense of shortness of breath during exertion;Long Term: Able to use Dyspnea scale to guide intensity level when exercising independently       Knowledge and understanding of Target Heart Rate Range (THRR) Yes       Intervention Provide education and explanation of THRR including how the numbers were predicted and where they are located for reference       Expected Outcomes Short Term: Able to state/look up THRR;Short Term: Able to use daily as guideline for intensity in rehab;Long Term: Able to use THRR to govern intensity when exercising independently       Able to check pulse independently Yes       Intervention Provide education and demonstration on how to check pulse in carotid and radial arteries.;Review the importance of being able to check your own pulse for safety during independent exercise       Expected Outcomes Short Term: Able to explain why pulse checking is important during independent exercise;Long Term: Able to check pulse independently and accurately       Understanding of Exercise Prescription Yes       Intervention Provide education, explanation, and written materials on patient's individual exercise prescription       Expected Outcomes Short Term: Able to explain program exercise prescription;Long  Term: Able to explain home exercise prescription to exercise independently              Exercise Goals Re-Evaluation :  Exercise Goals Re-Evaluation    Row Name 11/01/20 0836 11/08/20 1629 11/10/20 0829 11/23/20 1543 12/08/20 0813     Exercise Goal Re-Evaluation   Exercise Goals Review Able to understand and use rate of perceived exertion (RPE) scale;Able to understand and use Dyspnea scale;Knowledge and understanding of Target Heart Rate Range (THRR);Understanding of Exercise Prescription Increase Physical Activity;Increase Strength and Stamina;Understanding of Exercise Prescription Increase Physical Activity;Increase Strength and Stamina;Able to understand and use rate of perceived exertion (RPE) scale;Knowledge and understanding of Target Heart Rate Range (THRR);Able to check pulse independently;Understanding of Exercise Prescription Increase Physical Activity;Increase Strength and Stamina;Understanding of Exercise Prescription Increase Physical Activity;Increase Strength and Stamina;Understanding of Exercise Prescription   Comments Reviewed RPE and dyspnea scales, THR and program prescription with pt today.  Pt voiced understanding and was given a copy of goals to take home.  Ronalee Belts is off to a good start in rehab.  He has completed his first three full days of exercise already.  We will continue to monitor his progress. Reviewed home exercise with pt today.  Pt plans to walk and use stationary bike for exercise.  Reviewed THR, pulse, RPE, sign and symptoms, pulse oximetery and when to call 911 or MD.  Also discussed weather considerations and indoor options.  Pt voiced understanding. Only attended once since last review.  Currently in hospital He has a recumbant bike he uses (hasn't in the last week or so). He was doing it 3-4x/week and 30 minutes (warm up and cool down included) RPE 13-15.   Expected Outcomes Short: Use RPE daily to regulate intensity. Long: Follow program prescription in THR. Short:  Continue to attend rehab regularly  Long: Continue to follow program prescription. Short: add 2 days of exercise outside program sessions Long:  improve overall MET level Short: Clearance to return once discharged from hospital Long: Return to regular attendance ST: return to exercising at home using his recumbant bike LT: continue regular attendance at rehab, exercise at home when not at rehab regularly   [place] Name 12/22/20 1031 01/04/21 1304 01/17/21 1402 02/01/21 1244 02/04/21 0749     Exercise Goal Re-Evaluation   Exercise Goals Review Increase Physical Activity;Increase Strength and Stamina;Understanding of Exercise Prescription Increase Physical Activity;Increase Strength and Stamina Increase Physical Activity;Increase Strength and Stamina;Understanding of Exercise Prescription Increase Physical Activity;Increase Strength and Stamina Increase Physical Activity;Increase Strength and Stamina;Understanding of Exercise Prescription   Comments Ronalee Belts has been doing well in rehab.  He is up to level 4 on the XR.  He called out today with another fever.  We will continue to monitor his progress. Ronalee Belts has increased to level 5 on XR and is at 3 mph and .5 % on TM.  He does reach lower end of THR range.  We will continue to monitor progress. Ronalee Belts is doing well in rehab.  He is up to 5.3 METs on the XR.  We will continue to monitor his progress. Ronalee Belts has increased levels on TM and XR.  He reaches low end of THR range.  Staff will monitor progress. Ronalee Belts is doing well in rehab.  He is exercising at home.  He bought a NuStep for home and can watch TV while he exercising.  He is usually on there for about 40 min.  He is feeling pretty good about  it.   Expected Outcomes Short: Figure out fever Long: Continue to improve st[place]a. Short: attend consistently Long: build stamina overall Short: Increase workloads on T5 NuStep Long: Continue to improve stamina Short: continue to progress workloads Long:  build overall stamina  Short: Continue to exercise on off days Long: Continue to improve stamina   Row Name 02/15/21 1434 02/28/21 0813           Exercise Goal Re-Evaluation   Exercise Goals Review Increase Physical Activity;Increase Strength and Stamina;Understanding of Exercise Prescription Increase Physical Activity;Increase Strength and Stamina      Comments Ronalee Belts is doing well in rehab.  He is up to level 8 on the XR.  We will continue to monitor his progress. Ronalee Belts is [facility] at [facility].  He bought a NuStep to have at home.      Expected Outcomes Short: Add more to treadmill Long: Continue to improve stamina Short:  continue consistent exercise Long:  maintain fitness long term  Discharge Exercise Prescription (Final Exercise Prescription Changes):  Exercise Prescription Changes - 03/01/21 1200      Response to Exercise   Blood Pressure (Admit) 142/80    Blood Pressure (Exercise) 152/64    Blood Pressure (Exit) 138/66    Heart Rate (Admit) 93 bpm    Heart Rate (Exercise) 104 bpm    Heart Rate (Exit) 90 bpm    Oxygen Saturation (Admit) 95 %    Oxygen Saturation (Exercise) 96 %    Oxygen Saturation (Exit) 98 %    Rating of Perceived Exertion (Exercise) 15    Symptoms none    Duration Continue with 30 min of aerobic exercise without signs/symptoms of physical distress.    Intensity THRR unchanged      Progression   Progression Continue to progress workloads to maintain intensity without signs/symptoms of physical distress.    Average METs 3.91      Resistance Training   Training Prescription Yes    Weight 5 lb    Reps 10-15      Interval Training   Interval Training No      Treadmill   MPH 2.8    Grade 2    Minutes 30    METs 3.91      Home Exercise Plan   Plans to continue exercise at [facility]    Frequency Add 2 additional days to program exercise sessions.    Initial Home Exercises Provided 11/10/20           Nutrition:  Target Goals:  Understanding of nutrition guidelines, daily intake of sodium '1500mg'$ , cholesterol '200mg'$ , calories 30% from fat and 7% or less from saturated fats, daily to have 5 or more servings of fruits and vegetables.  Education: All About Nutrition: -Group instruction provided by verbal, written material, interactive activities, discussions, models, and posters to present general guidelines for heart healthy nutrition including fat, fiber, MyPlate, the role of sodium in heart healthy nutrition, utilization of the nutrition label, and utilization of this knowledge for meal planning. Follow up email sent as well. Written material given at graduation.   Biometrics:  Pre Biometrics - 10/18/20 1637      Pre Biometrics   Height 5' 10.75" (1.797 m)    Weight 260 lb 8 oz (118.2 kg)    BMI (Calculated) 36.59            Nutrition Therapy Plan and Nutrition Goals:  Nutrition Therapy & Goals - 11/08/20 1047      Nutrition Therapy   Diet Heart healthy, low Na, diabetes friendly    Drug/Food Interactions Statins/Certain Fruits    Protein (specify units) 95g    Fiber 30 grams    Whole Grain Foods 3 servings    Saturated Fats 12 max. grams    Fruits and Vegetables 8 servings/day    Sodium 1.5 grams      Personal Nutrition Goals   Nutrition Goal ST: prepare lunches, prepare dinner 3x/week LT: cook/prepare most meals at home    Comments A1C 8 (January 2022). Insulin - short acting and long lasting. When his BG gets too low he uses orange juice. He feels he eats too much fatty foods. B: 3 tbsp - vanilla yogurt with 1/2 cup granola L: frozen meals D: fast food, frozen foods S: potato chips Drinks: diet soda and sweet tea (splenda). Discussed heart healthy eating and diabetes friendly eating. He feels he does not have a good diet and would like to start preparing his  own meals as he feels that was the barrier for him to eat healthier.      Intervention Plan   Intervention Prescribe, educate and counsel  regarding individualized specific dietary modifications aiming towards targeted core components such as weight, hypertension, lipid management, diabetes, heart failure and other comorbidities.;Nutrition handout(s) given to patient.    Expected Outcomes Short Term Goal: Understand basic principles of dietary content, such as calories, fat, sodium, cholesterol and nutrients.;Long Term Goal: Adherence to prescribed nutrition plan.;Short Term Goal: A plan has been developed with personal nutrition goals set during dietitian appointment.           Nutrition Assessments:  MEDIFICTS Score Key:  ?70 Need to make dietary changes   40-70 Heart Healthy Diet  ? 40 Therapeutic Level Cholesterol Diet  Flowsheet Row Cardiac Rehab from 03/11/2021 in [facility] Cardiac and Pulmonary Rehab  Picture Your Plate Total Score on Admission 59  Picture Your Plate Total Score on Discharge 69     Picture Your Plate Scores:  <44 Unhealthy dietary pattern with much room for improvement.  41-50 Dietary pattern unlikely to meet recommendations for good health and room for improvement.  51-60 More healthful dietary pattern, with some room for improvement.   >60 Healthy dietary pattern, although there may be some specific behaviors that could be improved.    Nutrition Goals Re-Evaluation:  Nutrition Goals Re-Evaluation    [place] Name 12/08/20 0818 01/14/21 0801 02/04/21 0752 02/28/21 0817       Goals   Current Weight -- 258 lb (117 kg) -- 259 lb (117.5 kg)    Nutrition Goal ST: prepare lunches, prepare dinner 3x/week LT: cook/prepare most meals at home Lose more weight. Weight goal of 215lbs. Improve sugar control --    Comment He reports eating more fruits and vegetables (apples, oranges, salads) and is eating less red meat (1-2x/week now). He is preparring two meals at home now, he reports no barriers at this time and would like to continue working towards his goal. Ronalee Belts states that he has to take a bit of insulin  which is making it hard to lose weight. He is trying to eat smaller meals throughout the day. Ronalee Belts is now exercisng daily and working on his insulin.  He is also trying to eat better and getting in more fruits and vegetables.  His wife is eating a keto like diet by reducing carbs and satruated fats and it is playing into his diet too.  They are getting better overall. Ronalee Belts has gained a little weight.  He says he has been eating too much and needs to get back to having more salad.  he has lots of veggies on his salad and Kens light dressing.    Expected Outcome ST: prepare lunches, prepare dinner 3x/week LT: cook/prepare most meals at home Short: lose 5 pounds in two weeks. Long: reach a weight of 215lbs Short: COntinue to cut back on sugar long: Continue to work on weight loss. Short: get back to more veggies Long: continue to work on weight loss and sugar control           Nutrition Goals Discharge (Final Nutrition Goals Re-Evaluation):  Nutrition Goals Re-Evaluation - 02/28/21 0817      Goals   Current Weight 259 lb (117.5 kg)    Comment Ronalee Belts has gained a little weight.  He says he has been eating too much and needs to get back to having more salad.  he has lots of veggies on his  salad and Kens light dressing.    Expected Outcome Short: get back to more veggies Long: continue to work on weight loss and sugar control           Psychosocial: Target Goals: Acknowledge presence or absence of significant depression and/or stress, maximize coping skills, provide positive support system. Participant is able to verbalize types and ability to use techniques and skills needed for reducing stress and depression.   Education: Stress, Anxiety, and Depression - Group verbal and visual presentation to define topics covered.  Reviews how body is impacted by stress, anxiety, and depression.  Also discusses healthy ways to reduce stress and to treat/manage anxiety and depression.  Written material given at  graduation. Flowsheet Row Cardiac Rehab from 02/23/2021 in [facility] Cardiac and Pulmonary Rehab  Date 11/10/20  Educator [facility]  Instruction Review Code 1- Verbalizes Understanding      Education: Sleep Hygiene -Provides group verbal and written instruction about how sleep can affect your health.  Define sleep hygiene, discuss sleep cycles and impact of sleep habits. Review good sleep hygiene tips.    Initial Review & Psychosocial Screening:  Initial Psych Review & Screening - 10/13/20 1542      Initial Review   Current issues with History of Depression;Current Depression      Family Dynamics   Good Support System? Yes    Comments He can look to his wife and his three dogs.He has a posiive outlook on his health. Ronalee Belts takes medication for his mood and states that it helps.      Barriers   Psychosocial barriers to participate in program The patient should benefit from training in stress management and relaxation.      Screening Interventions   Interventions Program counselor consult;To provide support and resources with identified psychosocial needs;Encouraged to exercise;Provide feedback about the scores to participant           Quality of Life Scores:   Quality of Life - 03/11/21 0920      Quality of Life Scores   Health/Function Pre 20.57 %    Health/Function Post 26.31 %    Health/Function % Change 27.9 %    Socioeconomic Pre 26.57 %    Socioeconomic Post 28.21 %    Socioeconomic % Change  6.17 %    Psych/Spiritual Pre 30 %    Psych/Spiritual Post 30 %    Psych/Spiritual % Change 0 %    Family Pre 28.5 %    Family Post 26.75 %    Family % Change -6.14 %    GLOBAL Pre 24.94 %    GLOBAL Post 27.63 %    GLOBAL % Change 10.79 %          Scores of 19 and below usually indicate a poorer quality of life in these areas.  A difference of  2-3 points is a clinically meaningful difference.  A difference of 2-3 points in the total score of the Quality of Life Index has been  associated with significant improvement in overall quality of life, self-image, physical symptoms, and general health in studies assessing change in quality of life.  PHQ-9: Recent Review Flowsheet Data    Depression screen [facility] 2/9 03/11/2021 10/18/2020   Decreased Interest 0 0   Down, Depressed, Hopeless 0 0   PHQ - 2 Score 0 0   Altered sleeping 1 0   Tired, decreased energy 1 0   Change in appetite 0 0   Feeling bad or failure about  yourself  0 0   Trouble concentrating 0 0   Moving slowly or fidgety/restless 0 0   Suicidal thoughts 0 0   PHQ-9 Score 2 0     Interpretation of Total Score  Total Score Depression Severity:  1-4 = Minimal depression, 5-9 = Mild depression, 10-14 = Moderate depression, 15-19 = Moderately severe depression, 20-27 = Severe depression   Psychosocial Evaluation and Intervention:  Psychosocial Evaluation - 10/13/20 1545      Psychosocial Evaluation & Interventions   Interventions Encouraged to exercise with the program and follow exercise prescription;Relaxation education;Stress management education    Comments He can look to his wife and his three dogs.He has a posiive outlook on his health. Ronalee Belts takes medication for his mood and states that it helps.    Expected Outcomes Short: Exercise regularly to support mental health and notify staff of any changes. Long: maintain mental health and well being through teaching of rehab or prescribed medications independently.    Continue Psychosocial Services  Follow up required by staff           Psychosocial Re-Evaluation:  Psychosocial Re-Evaluation    [place] Name 11/10/20 [id number] 12/08/20 0817 01/14/21 0804 02/04/21 0755 02/28/21 0820     Psychosocial Re-Evaluation   Current issues with -- -- None Identified None Identified None Identified   Comments Ronalee Belts reports no stress, depression or anxiety symptoms.  He sleeps well most of the time. Ronalee Belts reports no stress, depression or anxiety symptoms.  He sleeps well most  of the time: getting about 8 hours of sleep - he is up about every 2 hours to go to the bathroom. Patient reports no issues with their current mental states, sleep, stress, depression or anxiety. Will follow up with patient in a few weeks for any changes. Ronalee Belts is doing well mentally.  He sleeps well and denies any major stressors.  He is excited to transistion off monitor next week.  His is about 2/3 through program and doing well. Ronalee Belts reports no new changes in stress or sleep.   Expected Outcomes Short: continue to attend [facility] to help manage  stress/sleep Long: maintain positive outlook Short: continue to attend [facility] Track to help manage  stress/sleep Long: maintain positive outlook Short: Continue to exercise regularly to support mental health and notify staff of any changes. Long: maintain mental health and well being through teaching of rehab or prescribed medications independently. Short: Continue to focus on positive Long; Continue to stay healthy mentally. Short:continue current program Long: maintain positive outlook   Interventions -- Encouraged to attend Cardiac Rehabilitation for the exercise Encouraged to attend Cardiac Rehabilitation for the exercise Encouraged to attend Cardiac Rehabilitation for the exercise --   Continue Psychosocial Services  -- Follow up required by staff Follow up required by staff Follow up required by staff --          Psychosocial Discharge (Final Psychosocial Re-Evaluation):  Psychosocial Re-Evaluation - 02/28/21 0820      Psychosocial Re-Evaluation   Current issues with None Identified    Comments Ronalee Belts reports no new changes in stress or sleep.    Expected Outcomes Short:continue current program Long: maintain positive outlook           Vocational Rehabilitation: Provide vocational rehab assistance to qualifying candidates.   Vocational Rehab Evaluation & Intervention:   Education: Education Goals: Education classes will be provided on a  variety of topics geared toward better understanding of heart health and risk factor modification. Participant  will state understanding/return demonstration of topics presented as noted by education test scores.  Learning Barriers/Preferences:  Learning Barriers/Preferences - 10/13/20 1541      Learning Barriers/Preferences   Learning Barriers None    Learning Preferences None           General Cardiac Education Topics:  AED/CPR: - Group verbal and written instruction with the use of models to demonstrate the basic use of the AED with the basic ABC's of resuscitation.   Anatomy and Cardiac Procedures: - Group verbal and visual presentation and models provide information about basic cardiac anatomy and function. Reviews the testing methods done to diagnose heart disease and the outcomes of the test results. Describes the treatment choices: Medical Management, Angioplasty, or Coronary Bypass Surgery for treating various heart conditions including Myocardial Infarction, Angina, Valve Disease, and Cardiac Arrhythmias.  Written material given at graduation. Flowsheet Row Cardiac Rehab from 02/23/2021 in [facility] Cardiac and Pulmonary Rehab  Date 02/02/21  Educator SB  Instruction Review Code 1- Verbalizes Understanding      Medication Safety: - Group verbal and visual instruction to review commonly prescribed medications for heart and lung disease. Reviews the medication, class of the drug, and side effects. Includes the steps to properly store meds and maintain the prescription regimen.  Written material given at graduation. Flowsheet Row Cardiac Rehab from 02/23/2021 in [facility] Cardiac and Pulmonary Rehab  Date 02/23/21  Educator SB  Instruction Review Code 1- Verbalizes Understanding      Intimacy: - Group verbal instruction through game format to discuss how heart and lung disease can affect sexual intimacy. Written material given at graduation..   Know Your Numbers and Heart  Failure: - Group verbal and visual instruction to discuss disease risk factors for cardiac and pulmonary disease and treatment options.  Reviews associated critical values for Overweight/Obesity, Hypertension, Cholesterol, and Diabetes.  Discusses basics of heart failure: signs/symptoms and treatments.  Introduces Heart Failure Zone chart for action plan for heart failure.  Written material given at graduation.   Infection Prevention: - Provides verbal and written material to individual with discussion of infection control including proper hand washing and proper equipment cleaning during exercise session. Flowsheet Row Cardiac Rehab from 02/23/2021 in [facility] Cardiac and Pulmonary Rehab  Date 10/18/20  Educator AS  Instruction Review Code 1- Verbalizes Understanding      Falls Prevention: - Provides verbal and written material to individual with discussion of falls prevention and safety. Flowsheet Row Cardiac Rehab from 02/23/2021 in [facility] Cardiac and Pulmonary Rehab  Date 10/18/20  Educator AS  Instruction Review Code 1- Verbalizes Understanding      Other: -Provides group and verbal instruction on various topics (see comments)   Knowledge Questionnaire Score:  Knowledge Questionnaire Score - 03/11/21 0921      Knowledge Questionnaire Score   Pre Score 21/26 exercise nutrition    Post Score 26/26           Core Components/Risk Factors/Patient Goals at Admission:  Personal Goals and Risk Factors at Admission - 10/18/20 1643      Core Components/Risk Factors/Patient Goals on Admission    Weight Management Weight Loss;Yes    Intervention Weight Management: Develop a combined nutrition and exercise program designed to reach desired caloric intake, while maintaining appropriate intake of nutrient and fiber, sodium and fats, and appropriate energy expenditure required for the weight goal.;Weight Management: Provide education and appropriate resources to help participant work on and  attain dietary goals.;Weight Management/Obesity: Establish reasonable short term  and long term weight goals.    Admit Weight 260 lb 8 oz (118.2 kg)    Goal Weight: Short Term 255 lb (115.7 kg)    Goal Weight: Long Term 225 lb (102.1 kg)    Expected Outcomes Long Term: Adherence to nutrition and physical activity/exercise program aimed toward attainment of established weight goal;Short Term: Continue to assess and modify interventions until short term weight is achieved;Weight Loss: Understanding of general recommendations for a balanced deficit meal plan, which promotes 1-2 lb weight loss per week and includes a negative energy balance of [phone] kcal/d;Understanding recommendations for meals to include 15-35% energy as protein, 25-35% energy from fat, 35-60% energy from carbohydrates, less than $RemoveB'200mg'UPkKlxbl$  of dietary cholesterol, 20-35 gm of total fiber daily;Understanding of distribution of calorie intake throughout the day with the consumption of 4-5 meals/snacks    Diabetes Yes    Intervention Provide education about signs/symptoms and action to take for hypo/hyperglycemia.;Provide education about proper nutrition, including hydration, and aerobic/resistive exercise prescription along with prescribed medications to achieve blood glucose in normal ranges: Fasting glucose 65-99 mg/dL    Expected Outcomes Short Term: Participant verbalizes understanding of the signs/symptoms and immediate care of hyper/hypoglycemia, proper foot care and importance of medication, aerobic/resistive exercise and nutrition plan for blood glucose control.;Long Term: Attainment of HbA1C < 7%.    Hypertension Yes    Intervention Provide education on lifestyle modifcations including regular physical activity/exercise, weight management, moderate sodium restriction and increased consumption of fresh fruit, vegetables, and low fat dairy, alcohol moderation, and smoking cessation.;Monitor prescription use compliance.    Expected Outcomes  Short Term: Continued assessment and intervention until BP is < 140/96mm HG in hypertensive participants. < 130/88mm HG in hypertensive participants with diabetes, heart failure or chronic kidney disease.;Long Term: Maintenance of blood pressure at goal levels.    Lipids Yes    Intervention Provide education and support for participant on nutrition & aerobic/resistive exercise along with prescribed medications to achieve LDL '70mg'$ , HDL >$Remo'40mg'nOcAv$ .    Expected Outcomes Short Term: Participant states understanding of desired cholesterol values and is compliant with medications prescribed. Participant is following exercise prescription and nutrition guidelines.;Long Term: Cholesterol controlled with medications as prescribed, with individualized exercise RX and with personalized nutrition plan. Value goals: LDL < $Rem'70mg'qsiA$ , HDL > 40 mg.           Education:Diabetes - Individual verbal and written instruction to review signs/symptoms of diabetes, desired ranges of glucose level fasting, after meals and with exercise. Acknowledge that pre and post exercise glucose checks will be done for 3 sessions at entry of program. [place] from 02/23/2021 in [facility] Cardiac and Pulmonary Rehab  Date 10/18/20  Educator AS  Instruction Review Code 1- Verbalizes Understanding      Core Components/Risk Factors/Patient Goals Review:   Goals and Risk Factor Review    Row Name 11/10/20 0809 12/08/20 0812 01/14/21 0758 02/04/21 0751 02/28/21 0811     Core Components/Risk Factors/Patient Goals Review   Personal Goals Review Weight Management/Obesity;Lipids;Diabetes;Hypertension Weight Management/Obesity;Lipids;Diabetes;Hypertension Weight Management/Obesity;Diabetes Weight Management/Obesity;Diabetes;Hypertension Weight Management/Obesity;Diabetes;Hypertension   Review Ronalee Belts is taking all medications as directed.  He does check BP and BG at home.  Today FBG was 126.  He has met with RD and is watching portion  sizes.  He will have blood work to recheck cholesterol etc in a couple months. He reports he has lost over 10 pounds in the hospital - likely muscle tissue - his weight has been stable while he  has been at rehab. Ronalee Belts is taking all medications as directed. He checks BP and BG at home; BG ~150, BP 140/80.  He will have blood work to recheck cholesterol etc in a couple months. He continues to work on his diet and attend rehab. Ronalee Belts has been checking his sugar at home routinely. He is trying to keep his sugar below 130 fasting. When his sugar is higher its because he eats later. He is good at eating earlier in the day and has to eat small meals throughout the day. Ronalee Belts is working to improve his blood sugars.  His A1c is down to 6.5!!  He is struggling with his insulin control as he is now exercising daily.  He is trying. His weight is coming down and was 254 lb today!!  His pressures are doing well. Ronalee Belts has been checking BG daily.  He has cut back on insulin some.  He is speaking with his Dr about that.  His next check is in about 3 months.  He has gained some weight - he syasy he is eating too much.  He also monitors BP at home.  He reports back to his Dr.   [place] Retort Outcomes Short:  continue to take meds as directed and monitor risk factors Long:  keep risk factors managed Short:  continue to take meds as directed and monitor risk factors Long:  keep risk factors managed Short: continue to check sugar and eat small meals. Long: get fasting BG below 130. Short: Continue to keep close eye on sugars Long: Continue to improve A1c and control. Short: follow up with Dr about insulin Long: manage risk factors long term          Core Components/Risk Factors/Patient Goals at Discharge (Final Review):   Goals and Risk Factor Review - 02/28/21 0811      Core Components/Risk Factors/Patient Goals Review   Personal Goals Review Weight Management/Obesity;Diabetes;Hypertension    Review Ronalee Belts has been checking BG daily.   He has cut back on insulin some.  He is speaking with his Dr about that.  His next check is in about 3 months.  He has gained some weight - he syasy he is eating too much.  He also monitors BP at home.  He reports back to his Dr.    [place] Retort Outcomes Short: follow up with Dr about insulin Long: manage risk factors long term           ITP Comments:  ITP Comments    Row Name 10/13/20 1540 10/18/20 1646 11/01/20 0836 11/03/20 0957 11/08/20 1114   ITP Comments Virtual Visit completed. Patient informed on EP and RD appointment and 6 Minute walk test. Patient also informed of patient health questionnaires on My Chart. Patient Verbalizes understanding. Visit diagnosis can be found in [facility]12/22/2021. Completed 6MWT and gym orientation. Initial ITP created and sent for review to Dr. Emily Filbert, Medical Director. First full day of exercise!  Patient was oriented to gym and equipment including functions, settings, policies, and procedures.  Patient's individual exercise prescription and treatment plan were reviewed.  All starting workloads were established based on the results of the 6 minute walk test done at initial orientation visit.  The plan for exercise progression was also introduced and progression will be customized based on patient's performance and goals. 30 Day review completed. Medical Director ITP review done, changes made as directed, and signed approval by Medical Director.  New to program Completed initial RD evaluation   Row Name  11/23/20 1537 12/01/20 0713 12/02/20 1309 12/08/20 0808 12/22/20 1031   ITP Comments Pt called out sick last week. Currently admitted to [facility] on 2/11 with pancreatitis. On going fever >10 days not responding to antibiotics 30 Day review completed. Medical Director ITP review done, changes made as directed, and signed approval by Medical Director.  REmains out for medical reasons Called to check in on pt.  He is home and seen in f/u.  Out for week.  He is scheduled to  return on Wed 3/2. Patient returned today and provided letter from MD illustrating approval and safety to continue with rehab. Pt called out today with a fever again.   [place] Name 12/29/20 1127 01/24/21 0841 01/26/21 0639 02/23/21 0958 03/11/21 0844   ITP Comments 30 Day review completed. Medical Director ITP review done, changes made as directed, and signed approval by Medical Director. Blood sugar started dropping. Had juice with resolution of symptoms and then ate a pack of cra 30 Day review completed. Medical Director ITP review done, changes made as directed, and signed approval by Medical Director. 30 Day review completed. Medical Director ITP review done, changes made as directed, and signed approval by Medical Director. Aram graduated today from  rehab with 35 sessions completed.  Details of the patient's exercise prescription and what He needs to do in order to continue the prescription and progress were discussed with patient.  Patient was given a copy of prescription and goals.  Patient verbalized understanding.  Adric plans to continue to exercise by walking at home.          Comments: Discharge ITP

## 2021-03-11 NOTE — Progress Notes (Signed)
Discharge Progress Report  Patient Details  Name: Derek Dickerson MRN: [id number] Date of Birth: January 30, 1961 Referring Provider:   Flowsheet Row Cardiac Rehab from 10/18/2020 in [facility]  Referring Provider Margaretha Glassing       Number of Visits: 35  Reason for Discharge:  Patient reached a stable level of exercise. Patient independent in their exercise.  Smoking History:  Social History   Tobacco Use  Smoking Status Former Smoker  . Packs/day: 1.00  . Years: 40.00  . Pack years: 40.00  . Types: Cigarettes  . Quit date: 2013  . Years since quitting: 9.4  Smokeless Tobacco Never Used    Diagnosis:  S/P PTCA (percutaneous transluminal coronary angioplasty)   Initial Exercise Prescription:  Initial Exercise Prescription - 10/18/20 1600      Date of Initial Exercise RX and Referring Provider   Date 10/18/20    Referring Provider Margaretha Glassing      Treadmill   MPH 2.5    Grade 0.5    Minutes 15    METs 3.3      REL-XR   Level 3    Speed 50    Minutes 15    METs 3.3      T5 Nustep   Level 2    SPM 80    Minutes 15    METs 3.3      Prescription Details   Frequency (times per week) 3    Duration Progress to 30 minutes of continuous aerobic without signs/symptoms of physical distress      Intensity   THRR 40-80% of Max Heartrate 109-142    Ratings of Perceived Exertion 11-15    Perceived Dyspnea 0-4      Resistance Training   Training Prescription Yes    Weight 4 lb    Reps 10-15           Discharge Exercise Prescription (Final Exercise Prescription Changes):  Exercise Prescription Changes - 03/01/21 1200      Response to Exercise   Blood Pressure (Admit) 142/80    Blood Pressure (Exercise) 152/64    Blood Pressure (Exit) 138/66    Heart Rate (Admit) 93 bpm    Heart Rate (Exercise) 104 bpm    Heart Rate (Exit) 90 bpm    Oxygen Saturation (Admit) 95 %    Oxygen Saturation (Exercise) 96 %    Oxygen Saturation (Exit) 98 %    Rating  of Perceived Exertion (Exercise) 15    Symptoms none    Duration Continue with 30 min of aerobic exercise without signs/symptoms of physical distress.    Intensity THRR unchanged      Progression   Progression Continue to progress workloads to maintain intensity without signs/symptoms of physical distress.    Average METs 3.91      Resistance Training   Training Prescription Yes    Weight 5 lb    Reps 10-15      Interval Training   Interval Training No      Treadmill   MPH 2.8    Grade 2    Minutes 30    METs 3.91      Home Exercise Plan   Plans to continue exercise at [facility]    Frequency Add 2 additional days to program exercise sessions.    Initial Home Exercises Provided 11/10/20           Functional Capacity:  6 Minute Walk    Row Name 10/18/20 1634 02/23/21  1045       6 Minute Walk   Phase Initial Discharge    Distance 1400 feet 1548 feet    Distance % Change -- 10.5 %    Distance Feet Change -- 148 ft    Walk Time 6 minutes 6 minutes    # of Rest Breaks 0 0    MPH 2.65 2.93    METS 3.3 3.5    RPE 12 10    Perceived Dyspnea  1 0    VO2 Peak 11.5 12.3    Symptoms No No    Resting HR 75 bpm 69 bpm    Resting BP 136/72 132/68    Resting Oxygen Saturation  99 % 98 %    Exercise Oxygen Saturation  during 6 min walk 98 % 99 %    Max Ex. HR 103 bpm 103 bpm    Max Ex. BP 160/76 152/70    2 Minute Post BP 134/60 --           Nutrition & Weight - Outcomes:  Pre Biometrics - 10/18/20 1637      Pre Biometrics   Height 5' 10.75" (1.797 m)    Weight 260 lb 8 oz (118.2 kg)    BMI (Calculated) 36.59          discharge weight 256.4 lbs  Nutrition:  Nutrition Therapy & Goals - 11/08/20 1047      Nutrition Therapy   Diet Heart healthy, low Na, diabetes friendly    Drug/Food Interactions Statins/Certain Fruits    Protein (specify units) 95g    Fiber 30 grams    Whole Grain Foods 3 servings    Saturated Fats 12 max. grams    Fruits and  Vegetables 8 servings/day    Sodium 1.5 grams      Personal Nutrition Goals   Nutrition Goal ST: prepare lunches, prepare dinner 3x/week LT: cook/prepare most meals at home    Comments A1C 8 (January 2022). Insulin - short acting and long lasting. When his BG gets too low he uses orange juice. He feels he eats too much fatty foods. B: 3 tbsp - vanilla yogurt with 1/2 cup granola L: frozen meals D: fast food, frozen foods S: potato chips Drinks: diet soda and sweet tea (splenda). Discussed heart healthy eating and diabetes friendly eating. He feels he does not have a good diet and would like to start preparing his own meals as he feels that was the barrier for him to eat healthier.      Intervention Plan   Intervention Prescribe, educate and counsel regarding individualized specific dietary modifications aiming towards targeted core components such as weight, hypertension, lipid management, diabetes, heart failure and other comorbidities.;Nutrition handout(s) given to patient.    Expected Outcomes Short Term Goal: Understand basic principles of dietary content, such as calories, fat, sodium, cholesterol and nutrients.;Long Term Goal: Adherence to prescribed nutrition plan.;Short Term Goal: A plan has been developed with personal nutrition goals set during dietitian appointment.           Education Questionnaire Score:  Knowledge Questionnaire Score - 03/11/21 0921      Knowledge Questionnaire Score   Pre Score 21/26 exercise nutrition    Post Score 26/26           Goals reviewed with patient; copy given to patient.

## 2021-03-11 NOTE — Progress Notes (Signed)
Daily Session Note  Patient Details  Name: Derek Dickerson MRN: [id number] Date of Birth: 12/08/1960 Referring Provider:   Flowsheet Row Cardiac Rehab from 10/18/2020 in [facility] Cardiac [facility] Re[facility]  Referring Provider [place]      Encounter Date: 03/11/2021  Check In:  Session Check In - 03/11/21 0843      Check-In   Supervising physician immediately available to respond to emergencies See telemetry face sheet for immediately available ER MD    Location [facility]-Cardiac & Pulmonary Rehab    Staff Present Susanne Bice, RN, BSN, CCRP;Jessica Hawkins, MA, RCEP, CCRP, CCET;Joseph Hood RCP,RRT,BSRT    Virtual Visit No    Medication changes reported     No    Fall or balance concerns reported    No    Warm-up and Cool-down Performed on first and last piece of equipment    Resistance Training Performed Yes    VAD Patient? No    PAD/SET Patient? No      Pain Assessment   Currently in Pain? No/denies              Social History   Tobacco Use  Smoking Status Former Smoker  . Packs/day: 1.00  . Years: 40.00  . Pack years: 40.00  . Types: Cigarettes  . Quit date: 2013  . Years since quitting: 9.4  Smokeless Tobacco Never Used    Goals Met:  Independence with exercise equipment Exercise tolerated well No report of cardiac concerns or symptoms  Goals Unmet:  Not Applicable  Comments:  Derek Dickerson graduated today from  rehab with 35 sessions completed.  Details of the patient's exercise prescription and what He needs to do in order to continue the prescription and progress were discussed with patient.  Patient was given a copy of prescription and goals.  Patient verbalized understanding.  Derek Dickerson plans to continue to exercise by walking at home.    Dr. Mark Miller is Medical Director for [facility].  Dr. Fuad Aleskerov is Medical Director for [facility]. 

## 2021-09-13 ENCOUNTER — Other Ambulatory Visit: Payer: Self-pay | Admitting: Student

## 2021-09-13 DIAGNOSIS — K750 Abscess of liver: Secondary | ICD-10-CM

## 2021-10-11 ENCOUNTER — Other Ambulatory Visit: Payer: Medicare Other

## 2023-10-24 ENCOUNTER — Other Ambulatory Visit: Payer: Self-pay | Admitting: Family Medicine

## 2023-10-24 ENCOUNTER — Ambulatory Visit
Admission: RE | Admit: 2023-10-24 | Discharge: 2023-10-24 | Disposition: A | Discharge status: Home / Self Care | Payer: Medicare Other | Source: Ambulatory Visit | Attending: Family Medicine | Admitting: Family Medicine

## 2023-10-24 DIAGNOSIS — K861 Other chronic pancreatitis: Secondary | ICD-10-CM

## 2023-10-24 LAB — POCT I-STAT CREATININE: Creatinine, Ser: 1.2 mg/dL (ref 0.61–1.24)

## 2023-10-24 MED ORDER — IOHEXOL 300 MG/ML  SOLN
100.0000 mL | Freq: Once | INTRAMUSCULAR | Status: AC | PRN
  Administered 2023-10-24: 100 mL via INTRAVENOUS

## 2024-05-07 ENCOUNTER — Other Ambulatory Visit: Payer: Self-pay

## 2024-05-07 DIAGNOSIS — K8689 Other specified diseases of pancreas: Secondary | ICD-10-CM

## 2024-05-07 DIAGNOSIS — K861 Other chronic pancreatitis: Secondary | ICD-10-CM

## 2024-05-13 ENCOUNTER — Ambulatory Visit
Admission: RE | Admit: 2024-05-13 | Discharge: 2024-05-13 | Disposition: A | Discharge status: Home / Self Care | Source: Ambulatory Visit

## 2024-05-13 DIAGNOSIS — K861 Other chronic pancreatitis: Secondary | ICD-10-CM | POA: Diagnosis present

## 2024-05-13 DIAGNOSIS — K8689 Other specified diseases of pancreas: Secondary | ICD-10-CM | POA: Diagnosis present

## 2024-05-13 MED ORDER — IOHEXOL 300 MG/ML  SOLN
100.0000 mL | Freq: Once | INTRAMUSCULAR | Status: AC | PRN
  Administered 2024-05-13: 100 mL via INTRAVENOUS

## 2024-06-17 ENCOUNTER — Ambulatory Visit: Admitting: Podiatry
